# Patient Record
Sex: Female | Born: 1995 | Race: White | Hispanic: No | Marital: Single | Smoking: Former smoker
Health system: Southern US, Community
[De-identification: ages and names within clinical notes are randomized; demographics above are authoritative.]

## PROBLEM LIST (undated history)

## (undated) DIAGNOSIS — F319 Bipolar disorder, unspecified: Secondary | ICD-10-CM

## (undated) DIAGNOSIS — F603 Borderline personality disorder: Secondary | ICD-10-CM

## (undated) DIAGNOSIS — Z789 Other specified health status: Secondary | ICD-10-CM

## (undated) DIAGNOSIS — F419 Anxiety disorder, unspecified: Secondary | ICD-10-CM

## (undated) DIAGNOSIS — J45909 Unspecified asthma, uncomplicated: Secondary | ICD-10-CM

## (undated) HISTORY — PX: NO PAST SURGERIES: SHX2092

---
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
## 2014-01-31 ENCOUNTER — Encounter (HOSPITAL_COMMUNITY): Payer: Self-pay | Admitting: Emergency Medicine

## 2014-01-31 ENCOUNTER — Emergency Department (HOSPITAL_COMMUNITY): Payer: Medicaid Other

## 2014-01-31 ENCOUNTER — Emergency Department (HOSPITAL_COMMUNITY)
Admission: EM | Admit: 2014-01-31 | Discharge: 2014-01-31 | Disposition: A | Discharge status: Home / Self Care | Payer: Medicaid Other | Attending: Emergency Medicine | Admitting: Emergency Medicine

## 2014-01-31 DIAGNOSIS — IMO0002 Reserved for concepts with insufficient information to code with codable children: Secondary | ICD-10-CM | POA: Insufficient documentation

## 2014-01-31 DIAGNOSIS — F17200 Nicotine dependence, unspecified, uncomplicated: Secondary | ICD-10-CM | POA: Insufficient documentation

## 2014-01-31 DIAGNOSIS — Z79899 Other long term (current) drug therapy: Secondary | ICD-10-CM | POA: Diagnosis not present

## 2014-01-31 DIAGNOSIS — J029 Acute pharyngitis, unspecified: Secondary | ICD-10-CM | POA: Diagnosis not present

## 2014-01-31 DIAGNOSIS — R059 Cough, unspecified: Secondary | ICD-10-CM | POA: Insufficient documentation

## 2014-01-31 DIAGNOSIS — IMO0001 Reserved for inherently not codable concepts without codable children: Secondary | ICD-10-CM | POA: Insufficient documentation

## 2014-01-31 DIAGNOSIS — Z8659 Personal history of other mental and behavioral disorders: Secondary | ICD-10-CM | POA: Insufficient documentation

## 2014-01-31 DIAGNOSIS — R0602 Shortness of breath: Secondary | ICD-10-CM | POA: Diagnosis not present

## 2014-01-31 DIAGNOSIS — Z792 Long term (current) use of antibiotics: Secondary | ICD-10-CM | POA: Diagnosis not present

## 2014-01-31 DIAGNOSIS — R05 Cough: Secondary | ICD-10-CM | POA: Diagnosis not present

## 2014-01-31 DIAGNOSIS — J3489 Other specified disorders of nose and nasal sinuses: Secondary | ICD-10-CM | POA: Insufficient documentation

## 2014-01-31 MED ORDER — PREDNISONE 50 MG PO TABS
60.0000 mg | ORAL_TABLET | Freq: Once | ORAL | Status: AC
  Administered 2014-01-31: 60 mg via ORAL
  Filled 2014-01-31 (×2): qty 1

## 2014-01-31 MED ORDER — ALBUTEROL SULFATE (2.5 MG/3ML) 0.083% IN NEBU
2.5000 mg | INHALATION_SOLUTION | Freq: Once | RESPIRATORY_TRACT | Status: AC
  Administered 2014-01-31: 2.5 mg via RESPIRATORY_TRACT
  Filled 2014-01-31: qty 3

## 2014-01-31 MED ORDER — IPRATROPIUM-ALBUTEROL 0.5-2.5 (3) MG/3ML IN SOLN
3.0000 mL | Freq: Once | RESPIRATORY_TRACT | Status: AC
  Administered 2014-01-31: 3 mL via RESPIRATORY_TRACT
  Filled 2014-01-31: qty 3

## 2014-01-31 MED ORDER — AZITHROMYCIN 250 MG PO TABS: 250.0000 mg | ORAL_TABLET | Freq: Every day | ORAL | Status: DC

## 2014-01-31 MED ORDER — BENZONATATE 200 MG PO CAPS: 200.0000 mg | ORAL_CAPSULE | Freq: Three times a day (TID) | ORAL | Status: DC | PRN

## 2014-01-31 MED ORDER — PREDNISONE 20 MG PO TABS: ORAL_TABLET | ORAL | Status: DC

## 2014-01-31 MED ORDER — ALBUTEROL SULFATE HFA 108 (90 BASE) MCG/ACT IN AERS: 1.0000 | INHALATION_SPRAY | Freq: Four times a day (QID) | RESPIRATORY_TRACT | Status: DC | PRN

## 2014-01-31 NOTE — Discharge Instructions (Signed)
Cough, Adult  A cough is a reflex that helps clear your throat and airways. It can help heal the body or may be a reaction to an irritated airway. A cough may only last 2 or 3 weeks (acute) or may last more than 8 weeks (chronic).  CAUSES Acute cough:  Viral or bacterial infections. Chronic cough:  Infections.  Allergies.  Asthma.  Post-nasal drip.  Smoking.  Heartburn or acid reflux.  Some medicines.  Chronic lung problems (COPD).  Cancer. SYMPTOMS   Cough.  Fever.  Chest pain.  Increased breathing rate.  High-pitched whistling sound when breathing (wheezing).  Colored mucus that you cough up (sputum). TREATMENT   A bacterial cough may be treated with antibiotic medicine.  A viral cough must run its course and will not respond to antibiotics.  Your caregiver may recommend other treatments if you have a chronic cough. HOME CARE INSTRUCTIONS   Only take over-the-counter or prescription medicines for pain, discomfort, or fever as directed by your caregiver. Use cough suppressants only as directed by your caregiver.  Use a cold steam vaporizer or humidifier in your bedroom or home to help loosen secretions.  Sleep in a semi-upright position if your cough is worse at night.  Rest as needed.  Stop smoking if you smoke. SEEK IMMEDIATE MEDICAL CARE IF:   You have pus in your sputum.  Your cough starts to worsen.  You cannot control your cough with suppressants and are losing sleep.  You begin coughing up blood.  You have difficulty breathing.  You develop pain which is getting worse or is uncontrolled with medicine.  You have a fever. MAKE SURE YOU:   Understand these instructions.  Will watch your condition.  Will get help right away if you are not doing well or get worse. Document Released: 01/15/2011 Document Revised: 10/11/2011 Document Reviewed: 01/15/2011 Exit[employer] Patient Information 2015 [employer]. This information is not intended  to replace advice given to you by your health care provider. Make sure you discuss any questions you have with your health care provider.  

## 2014-01-31 NOTE — ED Notes (Addendum)
Pt has cough and feels sob since yesterday, Used mother's inhaler with some relief.  Alert, Nasal congestion, sore throat.  Yellow sputum at times

## 2014-02-02 NOTE — ED Provider Notes (Signed)
CSN: [id number]     Arrival date & time 01/31/14  1036 History   First MD Initiated Contact with Patient 01/31/14 1115     Chief Complaint  Patient presents with  . Cough     (Consider location/radiation/quality/duration/timing/severity/associated sxs/prior Treatment) Patient is a 18 y.o. female presenting with cough. The history is provided by the patient.  Cough Cough characteristics:  Productive Sputum characteristics:  Yellow Severity:  Moderate Onset quality:  Gradual Duration:  1 day Timing:  Intermittent Progression:  Worsening Chronicity:  New Smoker: yes   Context: upper respiratory infection   Relieved by:  Nothing Worsened by:  Nothing tried Ineffective treatments:  None tried Associated symptoms: myalgias, rhinorrhea, shortness of breath, sore throat and wheezing   Associated symptoms: no chest pain, no chills, no ear pain, no fever, no headaches, no rash and no sinus congestion   Shortness of breath:    Severity:  Mild   Onset quality:  Gradual   Timing:  Intermittent   Progression:  Unchanged Sore throat:    Severity:  Mild   Onset quality:  Gradual   Timing:  Constant   Progression:  Unchanged Risk factors: no recent infection and no recent travel     Past Medical History  Diagnosis Date  . No pertinent past medical history   . Bipolar 1 disorder   . Anxiety    Past Surgical History  Procedure Laterality Date  . No past surgeries     History reviewed. No pertinent family history. History  Substance Use Topics  . Smoking status: Current Every Day Smoker -- 1.00 packs/day for 1 years    Types: Cigarettes  . Smokeless tobacco: Never Used  . Alcohol Use: No   OB History   Grav Para Term Preterm Abortions TAB SAB Ect Mult Living                 Review of Systems  Constitutional: Negative for fever, chills, activity change and appetite change.  HENT: Positive for congestion, rhinorrhea and sore throat. Negative for ear pain, facial swelling and  trouble swallowing.   Eyes: Negative for visual disturbance.  Respiratory: Positive for cough, shortness of breath and wheezing. Negative for chest tightness and stridor.   Cardiovascular: Negative for chest pain.  Gastrointestinal: Negative for nausea, vomiting and abdominal pain.  Genitourinary: Negative for dysuria and flank pain.  Musculoskeletal: Positive for myalgias. Negative for neck pain and neck stiffness.  Skin: Negative.  Negative for rash.  Neurological: Negative for dizziness, syncope, weakness, numbness and headaches.  Hematological: Negative for adenopathy.  Psychiatric/Behavioral: Negative for confusion.  All other systems reviewed and are negative.     Allergies  Review of patient's allergies indicates no known allergies.  Home Medications   Prior to Admission medications   Medication Sig Start Date End Date Taking? Authorizing Provider  albuterol (PROVENTIL HFA;VENTOLIN HFA) 108 (90 BASE) MCG/ACT inhaler Inhale 1-2 puffs into the lungs every 6 (six) hours as needed for wheezing or shortness of breath. 01/31/14   Van Seymore L. Orpha Dain, PA-C  azithromycin (ZITHROMAX) 250 MG tablet Take 1 tablet (250 mg total) by mouth daily. Take first 2 tablets together, then 1 every day until finished. 01/31/14   Abhiraj Dozal L. Jlynn Ly, PA-C  benzonatate (TESSALON) 200 MG capsule Take 1 capsule (200 mg total) by mouth 3 (three) times daily as needed for cough. 01/31/14   Stephania Macfarlane L. Robert Sunga, PA-C  clotrimazole (LOTRIMIN) 1 % cream Apply to affected area 2 times daily 10/09/12  Mora BellmanHannah S Merrell, PA-C  predniSONE (DELTASONE) 20 MG tablet Take 3 tablets po qd x 2 days, then 2 tablets po qd x 2 days, then 1 tablet po qd x 2 days 01/31/14   Pinkie Manger L. Tashayla Therien, PA-C   BP 122/77  Pulse 96  Temp(Src) 97.7 F (36.5 C) (Oral)  Resp 14  Ht 5\' 8"  (1.727 m)  Wt 158 lb (71.668 kg)  BMI 24.03 kg/m2  SpO2 99%  LMP 01/14/2014 Physical Exam  Nursing note and vitals reviewed. Constitutional: She is oriented to  person, place, and time. She appears well-developed and well-nourished. No distress.  HENT:  Head: Normocephalic and atraumatic.  Right Ear: Tympanic membrane and ear canal normal.  Left Ear: Tympanic membrane and ear canal normal.  Nose: Mucosal edema present.  Mouth/Throat: Uvula is midline and mucous membranes are normal. Posterior oropharyngeal edema and posterior oropharyngeal erythema present. No oropharyngeal exudate or tonsillar abscesses.  Eyes: EOM are normal. Pupils are equal, round, and reactive to light.  Neck: Normal range of motion. Neck supple.  Cardiovascular: Normal rate, regular rhythm, normal heart sounds and intact distal pulses.   No murmur heard. Pulmonary/Chest: Effort normal. She has wheezes. She has no rales. She exhibits no tenderness.  Coarse lung sounds bilaterally with few scattered expiratory wheezes  Abdominal: Soft. She exhibits no distension. There is no tenderness. There is no rebound and no guarding.  Musculoskeletal: Normal range of motion.  Lymphadenopathy:    She has no cervical adenopathy.  Neurological: She is alert and oriented to person, place, and time. Coordination normal.  Skin: Skin is warm and dry.    ED Course  Procedures (including critical care time) Labs Review Labs Reviewed - No data to display  Imaging Review Dg Chest 2 View  01/31/2014   CLINICAL DATA:  Cough  EXAM: CHEST  2 VIEW  COMPARISON:  07/10/2008  FINDINGS: Cardiomediastinal silhouette is stable. No acute infiltrate or pleural effusion. No pulmonary edema. Bony thorax is unremarkable.  IMPRESSION: No active cardiopulmonary disease.   Electronically Signed   By: Natasha MeadLiviu  Pop M.D.   On: 01/31/2014 12:00    EKG Interpretation None      MDM   Final diagnoses:  Cough    Pt is well appearing,  Clinical suspicion for PE is low.  Lungs sounds improved after neb.  Pt is feeling better.  Agrees to treatment plan with prednisone, tessalon, inhaler and zithromax.  Appears stable  for d/c and agrees to return here if needed.      Letcher Schweikert L. Trisha Mangleriplett, PA-C 02/02/14 2253

## 2014-02-04 NOTE — ED Provider Notes (Signed)
Medical screening examination/treatment/procedure(s) were performed by non-physician practitioner and as supervising physician I was immediately available for consultation/collaboration.   EKG Interpretation None        Donivin Wirt W Raena Pau, MD 02/04/14 0936 

## 2014-12-18 ENCOUNTER — Encounter (HOSPITAL_COMMUNITY): Payer: Self-pay | Admitting: Emergency Medicine

## 2014-12-18 ENCOUNTER — Emergency Department (HOSPITAL_COMMUNITY)
Admission: EM | Admit: 2014-12-18 | Discharge: 2014-12-18 | Disposition: A | Discharge status: Home / Self Care | Payer: Medicaid Other | Attending: Emergency Medicine | Admitting: Emergency Medicine

## 2014-12-18 DIAGNOSIS — E860 Dehydration: Secondary | ICD-10-CM | POA: Insufficient documentation

## 2014-12-18 DIAGNOSIS — E876 Hypokalemia: Secondary | ICD-10-CM | POA: Insufficient documentation

## 2014-12-18 DIAGNOSIS — F410 Panic disorder [episodic paroxysmal anxiety] without agoraphobia: Secondary | ICD-10-CM | POA: Insufficient documentation

## 2014-12-18 DIAGNOSIS — Z3202 Encounter for pregnancy test, result negative: Secondary | ICD-10-CM | POA: Insufficient documentation

## 2014-12-18 DIAGNOSIS — Z720 Tobacco use: Secondary | ICD-10-CM | POA: Insufficient documentation

## 2014-12-18 LAB — POC URINE PREG, ED: PREG TEST UR: NEGATIVE

## 2014-12-18 LAB — CBC WITH DIFFERENTIAL/PLATELET
Basophils Absolute: 0 10*3/uL (ref 0.0–0.1)
Basophils Relative: 0 % (ref 0–1)
Eosinophils Absolute: 0.1 10*3/uL (ref 0.0–0.7)
Eosinophils Relative: 2 % (ref 0–5)
HCT: 37.8 % (ref 36.0–46.0)
Hemoglobin: 13.1 g/dL (ref 12.0–15.0)
Lymphocytes Relative: 24 % (ref 12–46)
Lymphs Abs: 1.1 10*3/uL (ref 0.7–4.0)
MCH: 29.1 pg (ref 26.0–34.0)
MCHC: 34.7 g/dL (ref 30.0–36.0)
MCV: 84 fL (ref 78.0–100.0)
Monocytes Absolute: 0.4 10*3/uL (ref 0.1–1.0)
Monocytes Relative: 9 % (ref 3–12)
Neutro Abs: 3.1 10*3/uL (ref 1.7–7.7)
Neutrophils Relative %: 65 % (ref 43–77)
Platelets: 371 10*3/uL (ref 150–400)
RBC: 4.5 MIL/uL (ref 3.87–5.11)
RDW: 13.6 % (ref 11.5–15.5)
WBC: 4.8 10*3/uL (ref 4.0–10.5)

## 2014-12-18 LAB — COMPREHENSIVE METABOLIC PANEL
ALBUMIN: 4.9 g/dL (ref 3.5–5.0)
ALK PHOS: 66 U/L (ref 38–126)
ALT: 22 U/L (ref 14–54)
AST: 24 U/L (ref 15–41)
Anion gap: 16 — ABNORMAL HIGH (ref 5–15)
BILIRUBIN TOTAL: 3.4 mg/dL — AB (ref 0.3–1.2)
BUN: 12 mg/dL (ref 6–20)
CALCIUM: 9.5 mg/dL (ref 8.9–10.3)
CHLORIDE: 101 mmol/L (ref 101–111)
CO2: 17 mmol/L — AB (ref 22–32)
Creatinine, Ser: 0.86 mg/dL (ref 0.44–1.00)
GFR calc Af Amer: >60 mL/min (ref >60)
Glucose, Bld: 91 mg/dL (ref 65–99)
Potassium: 3 mmol/L — ABNORMAL LOW (ref 3.5–5.1)
SODIUM: 134 mmol/L — AB (ref 135–145)
Total Protein: 7.8 g/dL (ref 6.5–8.1)

## 2014-12-18 LAB — RAPID URINE DRUG SCREEN, HOSP PERFORMED
Amphetamines: NOT DETECTED
BARBITURATES: NOT DETECTED
BENZODIAZEPINES: NOT DETECTED
COCAINE: NOT DETECTED
Opiates: NOT DETECTED
TETRAHYDROCANNABINOL: POSITIVE — AB

## 2014-12-18 LAB — CBG MONITORING, ED: Glucose-Capillary: 84 mg/dL (ref 65–99)

## 2014-12-18 LAB — ETHANOL: Alcohol, Ethyl (B): <5 mg/dL (ref <5)

## 2014-12-18 MED ORDER — ONDANSETRON 8 MG PO TBDP
ORAL_TABLET | ORAL | Status: AC
  Filled 2014-12-18: qty 1

## 2014-12-18 MED ORDER — ONDANSETRON 8 MG PO TBDP
8.0000 mg | ORAL_TABLET | Freq: Once | ORAL | Status: AC
  Administered 2014-12-18: 8 mg via ORAL

## 2014-12-18 MED ORDER — LORAZEPAM 1 MG PO TABS
1.0000 mg | ORAL_TABLET | Freq: Once | ORAL | Status: AC
  Administered 2014-12-18: 1 mg via ORAL
  Filled 2014-12-18: qty 1

## 2014-12-18 MED ORDER — POTASSIUM CHLORIDE CRYS ER 20 MEQ PO TBCR
40.0000 meq | EXTENDED_RELEASE_TABLET | Freq: Once | ORAL | Status: AC
  Administered 2014-12-18: 40 meq via ORAL
  Filled 2014-12-18: qty 2

## 2014-12-18 MED ORDER — LORAZEPAM 1 MG PO TABS: 1.0000 mg | ORAL_TABLET | Freq: Three times a day (TID) | ORAL | Status: DC | PRN

## 2014-12-18 NOTE — Discharge Instructions (Signed)
° °  [place] [facility] ° °[facility]  [employer] [facility] °[address]                 [address]         [address]  °[place]                                               [place]                              [place] °Phone:  [phone]                                  Phone:  [phone]                   Phone:  [phone] ° °[facility], [phone] °- [place] [facility] - [facility]- 1-888-581-9988 °      -     [facility] in [place], [address],                                  [phone], Insurance ° °[place] Child Abuse Hotline °(336) 342-1394 or [phone]) 342-3537 (After Hours) ° ° °Behavioral Health Services ° °Substance Abuse Resources: °- [facility]  336-882-2125 °- [facility] 336-784-9470 °- [facility] 336-285-9073 °- [facility] 336-845-3988 °- Residential & Outpatient Substance Abuse Program  800-659-3381 ° °Psychological Services: °- [facility]  832-9600 °- [facility]  [phone] °- [facility], [address], [place], ACCESS LINE: 1-800-853-5163 or 336-641-4981, Http://[email or web address].com/services/adult.htm ° °-  °- [facility]- 342-8273 °- [facility]- 703-3100 °- [facility]- [phone] ° ° ° ° °

## 2014-12-18 NOTE — ED Provider Notes (Signed)
CSN: [id number]     Arrival date & time 12/18/14  0151 History   First MD Initiated Contact with Patient 12/18/14 0214     Chief Complaint  Patient presents with  . Anxiety    Patient is a 19 y.o. female presenting with anxiety. The history is provided by the patient.  Anxiety This is a recurrent problem. The current episode started yesterday. The problem occurs constantly. The problem has been gradually worsening. Associated symptoms include shortness of breath. Nothing aggravates the symptoms. Nothing relieves the symptoms.  pt reports panic attack that started yesterday She reports feeling short of breath and numbness in her extremities No active CP No vomiting/diarrhea No fever She reports stressful situation at home She reports medication non-compliance and she has h/o anxiety in the past  She denies SI/HI  Past Medical History  Diagnosis Date  . No pertinent past medical history   . Bipolar 1 disorder   . Anxiety    Past Surgical History  Procedure Laterality Date  . No past surgeries     No family history on file. History  Substance Use Topics  . Smoking status: Current Every Day Smoker -- 0.50 packs/day for 1 years    Types: Cigarettes  . Smokeless tobacco: Never Used  . Alcohol Use: No   OB History    No data available     Review of Systems  Constitutional: Negative for fever.  Respiratory: Positive for shortness of breath.   Gastrointestinal: Negative for vomiting.  Psychiatric/Behavioral: Negative for suicidal ideas. The patient is nervous/anxious.   All other systems reviewed and are negative.     Allergies  Review of patient's allergies indicates no known allergies.  Home Medications   Prior to Admission medications   Not on File   BP 121/86 mmHg  Pulse 129  Temp(Src) 97.7 F (36.5 C) (Oral)  Resp 28  Ht 5\' 6"  (1.676 m)  Wt 140 lb (63.504 kg)  BMI 22.61 kg/m2  SpO2 100%  LMP 12/14/2014 (Approximate) Physical Exam CONSTITUTIONAL: Well  developed/well nourished, pt is anxious HEAD: Normocephalic/atraumatic EYES: EOMI/PERRL ENMT: Mucous membranes moist NECK: supple no meningeal signs SPINE/BACK:entire spine nontender CV: S1/S2 noted, no murmurs/rubs/gallops noted LUNGS: Lungs are clear to auscultation bilaterally, no apparent distress ABDOMEN: soft, nontender, no rebound or guarding, bowel sounds noted throughout abdomen GU:no cva tenderness NEURO: Pt is awake/alert/appropriate, moves all extremitiesx4.  No facial droop.   EXTREMITIES: pulses normal/equal, full ROM SKIN: warm, color normal PSYCH: anxious  ED Course  Procedures  3:40 AM Pt stable Denies SI Mild dehydration/hypokalemia noted 4:08 AM Pt improved HR is improved She is resting comfortably She is in no distress I feel she is safe for d/c home Advised need for close f/u Short course of ativan ordered BP 106/70 mmHg  Pulse 95  Temp(Src) 97.7 F (36.5 C) (Oral)  Resp 17  Ht 5\' 6"  (1.676 m)  Wt 140 lb (63.504 kg)  BMI 22.61 kg/m2  SpO2 100%  LMP 12/14/2014 (Approximate)  Labs Review Labs Reviewed  COMPREHENSIVE METABOLIC PANEL - Abnormal; Notable for the following:    Sodium 134 (*)    Potassium 3.0 (*)    CO2 17 (*)    Total Bilirubin 3.4 (*)    Anion gap 16 (*)    All other components within normal limits  URINE RAPID DRUG SCREEN (HOSP PERFORMED) - Abnormal; Notable for the following:    Tetrahydrocannabinol POSITIVE (*)    All other components within normal limits  CBC WITH DIFFERENTIAL/PLATELET  ETHANOL  POC URINE PREG, ED  CBG MONITORING, ED     EKG Interpretation   Date/Time:  Wednesday Dec 18 2014 02:20:59 EDT Ventricular Rate:  108 PR Interval:  104 QRS Duration: 90 QT Interval:  360 QTC Calculation: 482 R Axis:   74 Text Interpretation:  Sinus tachycardia RSR' in V1 or V2, probably normal  variant Borderline prolonged QT interval artifact noted No previous ECGs  available Confirmed by Bebe ShaggyWICKLINE  MD, Justene Jensen ([id number])  on 12/18/2014 2:53:20  AM     Medications  potassium chloride SA (K-DUR,KLOR-CON) CR tablet 40 mEq (not administered)  LORazepam (ATIVAN) tablet 1 mg (1 mg Oral Given 12/18/14 0215)  ondansetron (ZOFRAN-ODT) disintegrating tablet 8 mg (8 mg Oral Given 12/18/14 0234)    MDM   Final diagnoses:  Panic attack  Hypokalemia  Dehydration    Nursing notes including past medical history and social history reviewed and considered in documentation Labs/vital reviewed myself and considered during evaluation     Zadie Rhineonald Heydy Montilla, MD 12/18/14 [id number]

## 2014-12-18 NOTE — ED Notes (Signed)
Pt. Reports having "panic attack" after getting in argument with her boyfriend. Pt. Hyperventilating. Pt. C/o tingling to arms and legs. Instructed pt. To slow breathing.

## 2015-08-05 ENCOUNTER — Emergency Department (HOSPITAL_COMMUNITY): Payer: Medicaid Other

## 2015-08-05 ENCOUNTER — Encounter (HOSPITAL_COMMUNITY): Payer: Self-pay | Admitting: Emergency Medicine

## 2015-08-05 ENCOUNTER — Emergency Department (HOSPITAL_COMMUNITY)
Admission: EM | Admit: 2015-08-05 | Discharge: 2015-08-05 | Disposition: A | Discharge status: Home / Self Care | Payer: Medicaid Other | Attending: Emergency Medicine | Admitting: Emergency Medicine

## 2015-08-05 DIAGNOSIS — J4520 Mild intermittent asthma, uncomplicated: Secondary | ICD-10-CM

## 2015-08-05 DIAGNOSIS — F319 Bipolar disorder, unspecified: Secondary | ICD-10-CM | POA: Insufficient documentation

## 2015-08-05 DIAGNOSIS — F17210 Nicotine dependence, cigarettes, uncomplicated: Secondary | ICD-10-CM | POA: Insufficient documentation

## 2015-08-05 DIAGNOSIS — F419 Anxiety disorder, unspecified: Secondary | ICD-10-CM | POA: Insufficient documentation

## 2015-08-05 DIAGNOSIS — J4521 Mild intermittent asthma with (acute) exacerbation: Secondary | ICD-10-CM | POA: Insufficient documentation

## 2015-08-05 HISTORY — DX: Unspecified asthma, uncomplicated: J45.909

## 2015-08-05 MED ORDER — PREDNISONE 50 MG PO TABS
60.0000 mg | ORAL_TABLET | Freq: Once | ORAL | Status: AC
  Administered 2015-08-05: 60 mg via ORAL
  Filled 2015-08-05: qty 1

## 2015-08-05 MED ORDER — ALBUTEROL SULFATE HFA 108 (90 BASE) MCG/ACT IN AERS: 1.0000 | INHALATION_SPRAY | Freq: Four times a day (QID) | RESPIRATORY_TRACT | Status: DC | PRN

## 2015-08-05 MED ORDER — FLUTICASONE PROPIONATE HFA 110 MCG/ACT IN AERO: 2.0000 | INHALATION_SPRAY | Freq: Every day | RESPIRATORY_TRACT | Status: DC

## 2015-08-05 MED ORDER — ALBUTEROL SULFATE (2.5 MG/3ML) 0.083% IN NEBU
2.5000 mg | INHALATION_SOLUTION | Freq: Once | RESPIRATORY_TRACT | Status: AC
  Administered 2015-08-05: 2.5 mg via RESPIRATORY_TRACT
  Filled 2015-08-05: qty 3

## 2015-08-05 MED ORDER — PREDNISONE 20 MG PO TABS: 20.0000 mg | ORAL_TABLET | Freq: Two times a day (BID) | ORAL | Status: DC

## 2015-08-05 NOTE — Discharge Instructions (Signed)

## 2015-08-05 NOTE — ED Provider Notes (Signed)
CSN: [id number]     Arrival date & time 08/05/15  0200 History   First MD Initiated Contact with Patient 08/05/15 0247     Chief Complaint  Patient presents with  . Asthma      HPI  Patient reinjured evaluation of cough and wheezing. Has a inhaler at home as her mother's that she uses fairly often. She states she has never been formally diagnosed with asthma. Does smoke. No URI symptoms now states she had a cough and runny nose a few days ago. No fever. No chest pain. No other symptoms.  Past Medical History  Diagnosis Date  . No pertinent past medical history   . Bipolar 1 disorder (HCC)   . Anxiety   . Asthma    Past Surgical History  Procedure Laterality Date  . No past surgeries     History reviewed. No pertinent family history. Social History  Substance Use Topics  . Smoking status: Current Every Day Smoker -- 0.50 packs/day for 1 years    Types: Cigarettes  . Smokeless tobacco: Never Used  . Alcohol Use: No   OB History    No data available     Review of Systems  Constitutional: Negative for fever, chills, diaphoresis, appetite change and fatigue.  HENT: Negative for mouth sores, sore throat and trouble swallowing.   Eyes: Negative for visual disturbance.  Respiratory: Positive for cough, shortness of breath and wheezing. Negative for chest tightness.   Cardiovascular: Negative for chest pain.  Gastrointestinal: Negative for nausea, vomiting, abdominal pain, diarrhea and abdominal distention.  Endocrine: Negative for polydipsia, polyphagia and polyuria.  Genitourinary: Negative for dysuria, frequency and hematuria.  Musculoskeletal: Negative for gait problem.  Skin: Negative for color change, pallor and rash.  Neurological: Negative for dizziness, syncope, light-headedness and headaches.  Hematological: Does not bruise/bleed easily.  Psychiatric/Behavioral: Negative for behavioral problems and confusion.      Allergies  Review of patient's allergies  indicates no known allergies.  Home Medications   Prior to Admission medications   Medication Sig Start Date End Date Taking? Authorizing Provider  albuterol (PROVENTIL HFA;VENTOLIN HFA) 108 (90 Base) MCG/ACT inhaler Inhale 1-2 puffs into the lungs every 6 (six) hours as needed for wheezing. 08/05/15   Rolland PorterMark Lanna Labella, MD  fluticasone (FLOVENT HFA) 110 MCG/ACT inhaler Inhale 2 puffs into the lungs daily. 08/05/15   Rolland PorterMark Antario Yasuda, MD  LORazepam (ATIVAN) 1 MG tablet Take 1 tablet (1 mg total) by mouth every 8 (eight) hours as needed for anxiety. 12/18/14   Zadie Rhineonald Wickline, MD  predniSONE (DELTASONE) 20 MG tablet Take 1 tablet (20 mg total) by mouth 2 (two) times daily with a meal. 08/05/15   Rolland PorterMark Ersel Wadleigh, MD   BP 127/76 mmHg  Pulse 106  Temp(Src) 97.9 F (36.6 C) (Oral)  Resp 22  Ht 5\' 6"  (1.676 m)  Wt 128 lb (58.06 kg)  BMI 20.67 kg/m2  SpO2 95%  LMP 06/29/2015 Physical Exam  Constitutional: She is oriented to person, place, and time. She appears well-developed and well-nourished. No distress.  HENT:  Head: Normocephalic.  Eyes: Conjunctivae are normal. Pupils are equal, round, and reactive to light. No scleral icterus.  Neck: Normal range of motion. Neck supple. No thyromegaly present.  Cardiovascular: Normal rate and regular rhythm.  Exam reveals no gallop and no friction rub.   No murmur heard. Pulmonary/Chest: Effort normal. No respiratory distress. She has wheezes. She has no rales.  Abdominal: Soft. Bowel sounds are normal. She exhibits no distension.  There is no tenderness. There is no rebound.  Musculoskeletal: Normal range of motion.  Neurological: She is alert and oriented to person, place, and time.  Skin: Skin is warm and dry. No rash noted.  Psychiatric: She has a normal mood and affect. Her behavior is normal.    ED Course  Procedures (including critical care time) Labs Review Labs Reviewed - No data to display  Imaging Review Dg Chest 2 View  08/05/2015  CLINICAL DATA:  Acute  onset of wheezing, congestion and productive cough. Initial encounter. EXAM: CHEST  2 VIEW COMPARISON:  Chest radiograph performed 01/31/2014 FINDINGS: The lungs are well-aerated and clear. There is no evidence of focal opacification, pleural effusion or pneumothorax. The heart is normal in size; the mediastinal contour is within normal limits. No acute osseous abnormalities are seen. IMPRESSION: No acute cardiopulmonary process seen. Electronically Signed   By: Roanna Raider M.D.   On: 08/05/2015 02:53   I have personally reviewed and evaluated these images and lab results as part of my medical decision-making.   EKG Interpretation None      MDM   Final diagnoses:  Asthma, mild intermittent, uncomplicated    Patient given single nebulized albuterol. Symptoms improved. Clear lungs well oxygenated. Appropriate for discharge home. Plan prednisone 5 days, albuterol MDI, Flovent MDI daily    Rolland Porter, MD 08/05/15 [phone]

## 2015-08-05 NOTE — ED Notes (Signed)
Patient complaining of cough and wheezing since yesterday. States she is using inhaler with no relief.

## 2015-09-01 ENCOUNTER — Emergency Department (HOSPITAL_COMMUNITY)
Admission: EM | Admit: 2015-09-01 | Discharge: 2015-09-01 | Disposition: A | Discharge status: Home / Self Care | Payer: Medicaid Other | Attending: Emergency Medicine | Admitting: Emergency Medicine

## 2015-09-01 ENCOUNTER — Encounter (HOSPITAL_COMMUNITY): Payer: Self-pay | Admitting: *Deleted

## 2015-09-01 DIAGNOSIS — Z7951 Long term (current) use of inhaled steroids: Secondary | ICD-10-CM | POA: Insufficient documentation

## 2015-09-01 DIAGNOSIS — F17210 Nicotine dependence, cigarettes, uncomplicated: Secondary | ICD-10-CM | POA: Insufficient documentation

## 2015-09-01 DIAGNOSIS — Z7952 Long term (current) use of systemic steroids: Secondary | ICD-10-CM | POA: Insufficient documentation

## 2015-09-01 DIAGNOSIS — J45901 Unspecified asthma with (acute) exacerbation: Secondary | ICD-10-CM | POA: Insufficient documentation

## 2015-09-01 DIAGNOSIS — F419 Anxiety disorder, unspecified: Secondary | ICD-10-CM | POA: Insufficient documentation

## 2015-09-01 DIAGNOSIS — Z79899 Other long term (current) drug therapy: Secondary | ICD-10-CM | POA: Insufficient documentation

## 2015-09-01 MED ORDER — ALBUTEROL SULFATE HFA 108 (90 BASE) MCG/ACT IN AERS
2.0000 | INHALATION_SPRAY | RESPIRATORY_TRACT | Status: DC | PRN
  Administered 2015-09-01: 2 via RESPIRATORY_TRACT
  Filled 2015-09-01: qty 6.7

## 2015-09-01 MED ORDER — ALBUTEROL SULFATE (2.5 MG/3ML) 0.083% IN NEBU
INHALATION_SOLUTION | RESPIRATORY_TRACT | Status: AC
  Administered 2015-09-01: 5 mg
  Filled 2015-09-01: qty 6

## 2015-09-01 MED ORDER — ALBUTEROL SULFATE HFA 108 (90 BASE) MCG/ACT IN AERS: 2.0000 | INHALATION_SPRAY | RESPIRATORY_TRACT | Status: DC | PRN

## 2015-09-01 MED ORDER — IPRATROPIUM BROMIDE 0.02 % IN SOLN
RESPIRATORY_TRACT | Status: AC
  Administered 2015-09-01: 0.5 mg
  Filled 2015-09-01: qty 2.5

## 2015-09-01 NOTE — Discharge Instructions (Signed)
Use the inhaler as needed for wheezing. If you are having frequent episodes that you describe you need to get a primary care doctor and they may put you on a steroid inhaler in addition to your albuterol.    Asthma Attack Prevention While you may not be able to control the fact that you have asthma, you can take actions to prevent asthma attacks. The best way to prevent asthma attacks is to maintain good control of your asthma. You can achieve this by:  Taking your medicines as directed.  Avoiding things that can irritate your airways or make your asthma symptoms worse (asthma triggers).  Keeping track of how well your asthma is controlled and of any changes in your symptoms.  Responding quickly to worsening asthma symptoms (asthma attack).  Seeking emergency care when it is needed. WHAT ARE SOME WAYS TO PREVENT AN ASTHMA ATTACK? Have a Plan Work with your health care provider to create a written plan for managing and treating your asthma attacks (asthma action plan). This plan includes:  A list of your asthma triggers and how you can avoid them.  Information on when medicines should be taken and when their dosages should be changed.  The use of a device that measures how well your lungs are working (peak flow meter). Monitor Your Asthma Use your peak flow meter and record your results in a journal every day. A drop in your peak flow numbers on one or more days may indicate the start of an asthma attack. This can happen even before you start to feel symptoms. You can prevent an asthma attack from getting worse by following the steps in your asthma action plan. Avoid Asthma Triggers Work with your asthma health care provider to find out what your asthma triggers are. This can be done by:  Allergy testing.  Keeping a journal that notes when asthma attacks occur and the factors that may have contributed to them.  Determining if there are other medical conditions that are making your  asthma worse. Once you have determined your asthma triggers, take steps to avoid them. This may include avoiding excessive or prolonged exposure to:  Dust. Have someone dust and vacuum your home for you once or twice a week. Using a high-efficiency particulate arrestance (HEPA) vacuum is best.  Smoke. This includes campfire smoke, forest fire smoke, and secondhand smoke from tobacco products.  Pet dander. Avoid contact with animals that you know you are allergic to.  Allergens from trees, grasses or pollens. Avoid spending a lot of time outdoors when pollen counts are high, and on very windy days.  Very cold, dry, or humid air.  Mold.  Foods that contain high amounts of sulfites.  Strong odors.  Outdoor air pollutants, such as Museum/gallery exhibitions officer.  Indoor air pollutants, such as aerosol sprays and fumes from household cleaners.  Household pests, including dust mites and cockroaches, and pest droppings.  Certain medicines, including NSAIDs. Always talk to your health care provider before stopping or starting any new medicines. Medicines Take over-the-counter and prescription medicines only as told by your health care provider. Many asthma attacks can be prevented by carefully following your medicine schedule. Taking your medicines correctly is especially important when you cannot avoid certain asthma triggers. Act Quickly If an asthma attack does happen, acting quickly can decrease how severe it is and how long it lasts. Take these steps:   Pay attention to your symptoms. If you are coughing, wheezing, or having difficulty breathing, do not  wait to see if your symptoms go away on their own. Follow your asthma action plan.  If you have followed your asthma action plan and your symptoms are not improving, call your health care provider or seek immediate medical care at the nearest hospital. It is important to note how often you need to use your fast-acting rescue inhaler. If you are using  your rescue inhaler more often, it may mean that your asthma is not under control. Adjusting your asthma treatment plan may help you to prevent future asthma attacks and help you to gain better control of your condition. HOW CAN I PREVENT AN ASTHMA ATTACK WHEN I EXERCISE? Follow advice from your health care provider about whether you should use your fast-acting inhaler before exercising. Many people with asthma experience exercise-induced bronchoconstriction (EIB). This condition often worsens during vigorous exercise in cold, humid, or dry environments. Usually, people with EIB can stay very active by pre-treating with a fast-acting inhaler before exercising.   This information is not intended to replace advice given to you by your health care provider. Make sure you discuss any questions you have with your health care provider.   Document Released: 07/07/2009 Document Revised: 04/09/2015 Document Reviewed: 12/19/2014 Elsevier Interactive Patient Education [employer]

## 2015-09-01 NOTE — ED Provider Notes (Signed)
CSN: [id number]     Arrival date & time 09/01/15  9147 History   First MD Initiated Contact with Patient 09/01/15 0545     Chief Complaint  Patient presents with  . Shortness of Breath     (Consider location/radiation/quality/duration/timing/severity/associated sxs/prior Treatment) HPI  Patient has a history of reactive airway disease. She states she was awakened from sleep about 2 hours ago with wheezing and feeling short of breath. She has mild cough with white mucus at times, she denies sore throat, she does describe some mild clear rhinorrhea, she denies chest pain. She states her chest felt tight earlier however she is already been started on a nebulizer and she states her chest is less tight and is gone now. She states she comes to the ED about every 2 weeks due to asthma flares and she has asthma flares at home about every 2 days. She states she left her inhaler at her mother's house.  PCP Dr Nelva Bush  Past Medical History  Diagnosis Date  . No pertinent past medical history   . Bipolar 1 disorder (HCC)   . Anxiety   . Asthma    Past Surgical History  Procedure Laterality Date  . No past surgeries     History reviewed. No pertinent family history. Social History  Substance Use Topics  . Smoking status: Current Every Day Smoker -- 0.50 packs/day for 1 years    Types: Cigarettes  . Smokeless tobacco: Never Used  . Alcohol Use: No  unemployed  OB History    No data available     Review of Systems  All other systems reviewed and are negative.     Allergies  Review of patient's allergies indicates no known allergies.  Home Medications   Prior to Admission medications   Medication Sig Start Date End Date Taking? Authorizing Provider  albuterol (PROVENTIL HFA;VENTOLIN HFA) 108 (90 Base) MCG/ACT inhaler Inhale 2 puffs into the lungs every 4 (four) hours as needed. 09/01/15   Devoria Albe, MD  fluticasone (FLOVENT HFA) 110 MCG/ACT inhaler Inhale 2 puffs into the lungs  daily. 08/05/15   Rolland Porter, MD  LORazepam (ATIVAN) 1 MG tablet Take 1 tablet (1 mg total) by mouth every 8 (eight) hours as needed for anxiety. 12/18/14   Zadie Rhine, MD  predniSONE (DELTASONE) 20 MG tablet Take 1 tablet (20 mg total) by mouth 2 (two) times daily with a meal. 08/05/15   Rolland Porter, MD   BP 119/76 mmHg  Pulse 101  Temp(Src) 98.2 F (36.8 C) (Oral)  Resp 34  Ht  (1.676 m)  Wt 130 lb (58.968 kg)  BMI 20.99 kg/m2  SpO2 96%  LMP 06/28/2015  Vital signs normal except for tachycardia and tachypnea  Physical Exam  Constitutional: She is oriented to person, place, and time. She appears well-developed and well-nourished.  Non-toxic appearance. She does not appear ill. No distress.  HENT:  Head: Normocephalic and atraumatic.  Right Ear: External ear normal.  Left Ear: External ear normal.  Nose: Nose normal. No mucosal edema or rhinorrhea.  Mouth/Throat: Oropharynx is clear and moist and mucous membranes are normal. No dental abscesses or uvula swelling.  Eyes: Conjunctivae and EOM are normal. Pupils are equal, round, and reactive to light.  Neck: Normal range of motion and full passive range of motion without pain. Neck supple.  Cardiovascular: Normal rate, regular rhythm and normal heart sounds.  Exam reveals no gallop and no friction rub.   No murmur heard. Pulmonary/Chest:  Effort normal. Tachypnea noted. No respiratory distress. She has decreased breath sounds. She has no rales. She exhibits no tenderness and no crepitus.  Patient examined at the beginning of her nebulizer, she is noted to have rare rhonchi.  Abdominal: Soft. Normal appearance and bowel sounds are normal. She exhibits no distension. There is no tenderness. There is no rebound and no guarding.  Musculoskeletal: Normal range of motion. She exhibits no edema or tenderness.  Moves all extremities well.   Neurological: She is alert and oriented to person, place, and time. She has normal strength. No  cranial nerve deficit.  Skin: Skin is warm, dry and intact. No rash noted. No erythema. No pallor.  Psychiatric: She has a normal mood and affect. Her speech is normal and behavior is normal. Her mood appears not anxious.  Nursing note and vitals reviewed.   ED Course  Procedures (including critical care time)  Medications  albuterol (PROVENTIL HFA;VENTOLIN HFA) 108 (90 Base) MCG/ACT inhaler 2 puff (not administered)  ipratropium (ATROVENT) 0.02 % nebulizer solution (0.5 mg  Given 09/01/15 0543)  albuterol (PROVENTIL) (2.5 MG/3ML) 0.083% nebulizer solution (5 mg  Given 09/01/15 0543)    Patient was getting her first nebulizer during my initial exam.  Recheck at 6:30 AM patient states she's feeling much better. On exam she has much improved air movement without wheezing or rhonchi. This point she was felt to be ready for discharge.  Patient has only had 2 ED visits in this ED in the past 6 months. I asked her where she normally goes and she states she also goes to [facility] when I look at the number of x-rays that she is had ordered she has only had one in the past year. I am not sure for history of going to the [facility]SS frequent as she relates. Patient was referred to [facility] and she was given a inhaler to use from the ED and also a prescription. She was not started on steroids, she easily improved with one treatment and if she had had her inhaler she probably would not of had to come to the ED.    MDM   Final diagnoses:  Asthma exacerbation   New Prescriptions   ALBUTEROL (PROVENTIL HFA;VENTOLIN HFA) 108 (90 BASE) MCG/ACT INHALER    Inhale 2 puffs into the lungs every 4 (four) hours as needed.    Plan discharge  Devoria Albe, MD, Concha Pyo, MD 09/01/15 [phone]

## 2015-09-01 NOTE — ED Notes (Addendum)
Pt c/o waking up sob and having chest tightness. Pt denies cough or cold symptoms. Pt has asthma and her inhaler is at her moms. Pt

## 2015-09-23 ENCOUNTER — Emergency Department (HOSPITAL_COMMUNITY)
Admission: EM | Admit: 2015-09-23 | Discharge: 2015-09-23 | Disposition: A | Discharge status: Home / Self Care | Payer: No Typology Code available for payment source | Attending: Emergency Medicine | Admitting: Emergency Medicine

## 2015-09-23 ENCOUNTER — Encounter (HOSPITAL_COMMUNITY): Payer: Self-pay | Admitting: *Deleted

## 2015-09-23 DIAGNOSIS — K0889 Other specified disorders of teeth and supporting structures: Secondary | ICD-10-CM | POA: Insufficient documentation

## 2015-09-23 DIAGNOSIS — J45909 Unspecified asthma, uncomplicated: Secondary | ICD-10-CM | POA: Insufficient documentation

## 2015-09-23 NOTE — ED Notes (Signed)
Pt states dental pain for the past month. Has not seen a dentist, waiting on free clinic.

## 2016-03-03 ENCOUNTER — Emergency Department (HOSPITAL_COMMUNITY)
Admission: EM | Admit: 2016-03-03 | Discharge: 2016-03-03 | Disposition: A | Discharge status: Home / Self Care | Payer: Medicaid Other | Attending: Emergency Medicine | Admitting: Emergency Medicine

## 2016-03-03 ENCOUNTER — Encounter (HOSPITAL_COMMUNITY): Payer: Self-pay | Admitting: Emergency Medicine

## 2016-03-03 DIAGNOSIS — K0889 Other specified disorders of teeth and supporting structures: Secondary | ICD-10-CM | POA: Diagnosis not present

## 2016-03-03 DIAGNOSIS — Z791 Long term (current) use of non-steroidal anti-inflammatories (NSAID): Secondary | ICD-10-CM | POA: Diagnosis not present

## 2016-03-03 DIAGNOSIS — Z79899 Other long term (current) drug therapy: Secondary | ICD-10-CM | POA: Insufficient documentation

## 2016-03-03 DIAGNOSIS — Z87891 Personal history of nicotine dependence: Secondary | ICD-10-CM | POA: Diagnosis not present

## 2016-03-03 MED ORDER — PENICILLIN V POTASSIUM 250 MG PO TABS
500.0000 mg | ORAL_TABLET | Freq: Once | ORAL | Status: AC
  Administered 2016-03-03: 500 mg via ORAL
  Filled 2016-03-03: qty 2

## 2016-03-03 MED ORDER — ACETAMINOPHEN 325 MG PO TABS
650.0000 mg | ORAL_TABLET | Freq: Once | ORAL | Status: AC
  Administered 2016-03-03: 650 mg via ORAL
  Filled 2016-03-03: qty 2

## 2016-03-03 MED ORDER — PENICILLIN V POTASSIUM 500 MG PO TABS: 500.0000 mg | ORAL_TABLET | Freq: Four times a day (QID) | ORAL | 0 refills | Status: AC

## 2016-03-03 NOTE — Discharge Instructions (Signed)
Take the penicillin as prescribed and be sure to complete the entire course. Discontinue this medication if you experience rash and return immediately to the emergency department. Follow-up with a dentist within 2 days to have your tooth reevaluated. Follow-up with your primary care provider or OB/GYN within 2 days.  Return to the emergency department if you experience worsening pain, swelling of your throat or under your tongue, difficulty swallowing, difficulty opening your mouth fevers, chills, or any other concerning symptoms.

## 2016-03-03 NOTE — ED Provider Notes (Signed)
[facility] Provider Note   CSN: [id number] Arrival date & time: 03/03/16  2102  First Provider Contact:  None       History   Chief Complaint Chief Complaint  Patient presents with  . Dental Pain    HPI Jacqueline Velasquez is a 20 y.o. female.  HPI   Patient is a 20 year old pregnant female who presents the ED with worsening right lower dental/jaw pain since yesterday. Pain was intermittent until roughly 4 hours ago the pain became constant, sharp/stabbing, worse with touch, 10/10, she is tried 1600 mg of Tylenol today without relief. She's never had this type of pain before. Patient states she broke this tooth roughly 1 year ago. She denies fever, chills, nausea, vomiting, abdominal pain, difficulty swelling, swelling of the throat, pain under her tongue.  Past Medical History:  Diagnosis Date  . Anxiety   . Asthma   . Bipolar 1 disorder (HCC)   . No pertinent past medical history     Patient Active Problem List   Diagnosis Date Noted  . Mood disorder (HCC) 01/24/2012  . Borderline personality disorder 01/24/2012    Past Surgical History:  Procedure Laterality Date  . NO PAST SURGERIES      OB History    Gravida Para Term Preterm AB Living   1             SAB TAB Ectopic Multiple Live Births                   Home Medications    Prior to Admission medications   Medication Sig Start Date End Date Taking? Authorizing Provider  acetaminophen (PAIN & FEVER CHILDRENS) 80 MG chewable tablet Chew 80-160 mg by mouth once as needed for mild pain.   Yes Historical Provider, MD  ibuprofen (ADVIL,MOTRIN) 200 MG tablet Take 200-400 mg by mouth every 6 (six) hours as needed for mild pain or moderate pain.   Yes Historical Provider, MD  penicillin v potassium (VEETID) 500 MG tablet Take 1 tablet (500 mg total) by mouth 4 (four) times daily. For 7 days 03/03/16 03/10/16  Jerre Simon, PA    Family History No family history on file.  Social History Social History    Substance Use Topics  . Smoking status: Former Smoker    Packs/day: 0.50    Years: 1.00    Types: Cigarettes  . Smokeless tobacco: Never Used  . Alcohol use No     Allergies   Review of patient's allergies indicates no known allergies.   Review of Systems Review of Systems  Constitutional: Negative for chills and fever.  HENT: Positive for dental problem. Negative for facial swelling, trouble swallowing and voice change.   Gastrointestinal: Negative for nausea and vomiting.     Physical Exam Updated Vital Signs BP 94/79 (BP Location: Left Arm)   Pulse 92   Temp 98 F (36.7 C) (Oral)   Resp 20   Ht 5' 5.5" (1.664 m)   Wt 64.2 kg   LMP 11/17/2015 (Approximate)   SpO2 100%   BMI 23.21 kg/m   Physical Exam  Constitutional: She appears well-developed and well-nourished. No distress.  HENT:  Head: Normocephalic and atraumatic.  Mouth/Throat: Oropharynx is clear and moist and mucous membranes are normal. No trismus in the jaw. Abnormal dentition. Dental caries present. No dental abscesses or uvula swelling.    Broken tooth noted of the right lower jaw, surrounding erythema of the gingiva, mild edema noted, no  fluctuance or drainable abscess, no trismus, tooth is TTP  Eyes: Conjunctivae are normal.  Pulmonary/Chest: Effort normal. No respiratory distress.  Musculoskeletal: Normal range of motion.  Neurological: She is alert. Coordination normal.  Skin: Skin is warm and dry. She is not diaphoretic.  Psychiatric: She has a normal mood and affect. Her behavior is normal.  Nursing note and vitals reviewed.    ED Treatments / Results  Labs (all labs ordered are listed, but only abnormal results are displayed) Labs Reviewed - No data to display  EKG  EKG Interpretation None       Radiology No results found.  Procedures Procedures (including critical care time)  Medications Ordered in ED Medications  penicillin v potassium (VEETID) tablet 500 mg (not  administered)  acetaminophen (TYLENOL) tablet 650 mg (not administered)     Initial Impression / Assessment and Plan / ED Course  I have reviewed the triage vital signs and the nursing notes.  Pertinent labs & imaging results that were available during my care of the patient were reviewed by me and considered in my medical decision making (see chart for details).  Clinical Course    Patient with dentalgia.  No abscess requiring immediate incision and drainage.  Exam not concerning for Ludwig's angina or pharyngeal abscess.  Will treat with penicillin and tylenol. Pt instructed to follow-up with dentist and PCP or OB/GYN in 2 days. Discussed return precautions. Pt safe for discharge And expressed understanding to the discharge instructions.  Final Clinical Impressions(s) / ED Diagnoses   Final diagnoses:  Pain, dental    New Prescriptions New Prescriptions   PENICILLIN V POTASSIUM (VEETID) 500 MG TABLET    Take 1 tablet (500 mg total) by mouth 4 (four) times daily. For 7 days     Jerre Simon, [place] 03/04/16 7902    Bethann Berkshire, MD 03/04/16 [phone]

## 2016-03-03 NOTE — ED Triage Notes (Signed)
Pt c/o rt sided jaw pain and ear pain that started today.

## 2016-03-08 IMAGING — CR DG CHEST 2V
2 series · 2 of 2 positions shown · non-contrast
Comparison: 07/10/2008

CLINICAL DATA: Cough

EXAM:
CHEST  2 VIEW

[view not recorded (1 of 2)]
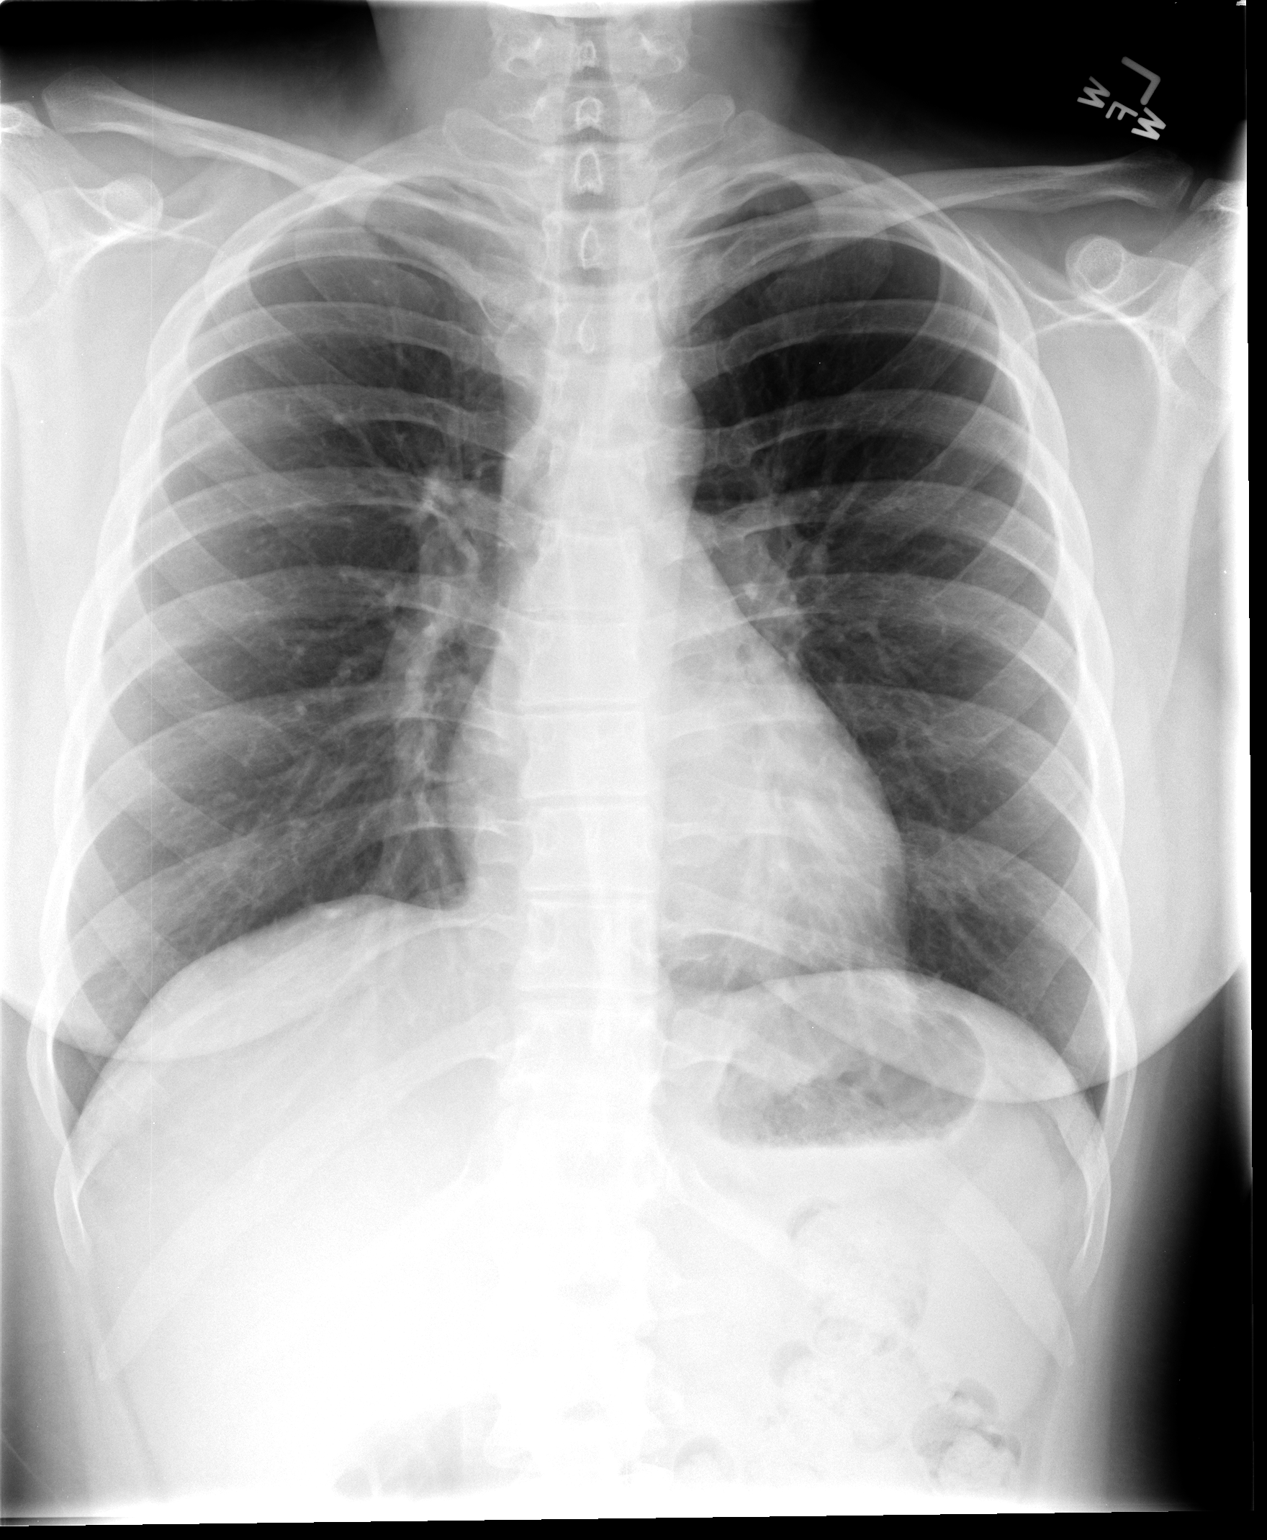

[view not recorded (2 of 2)]
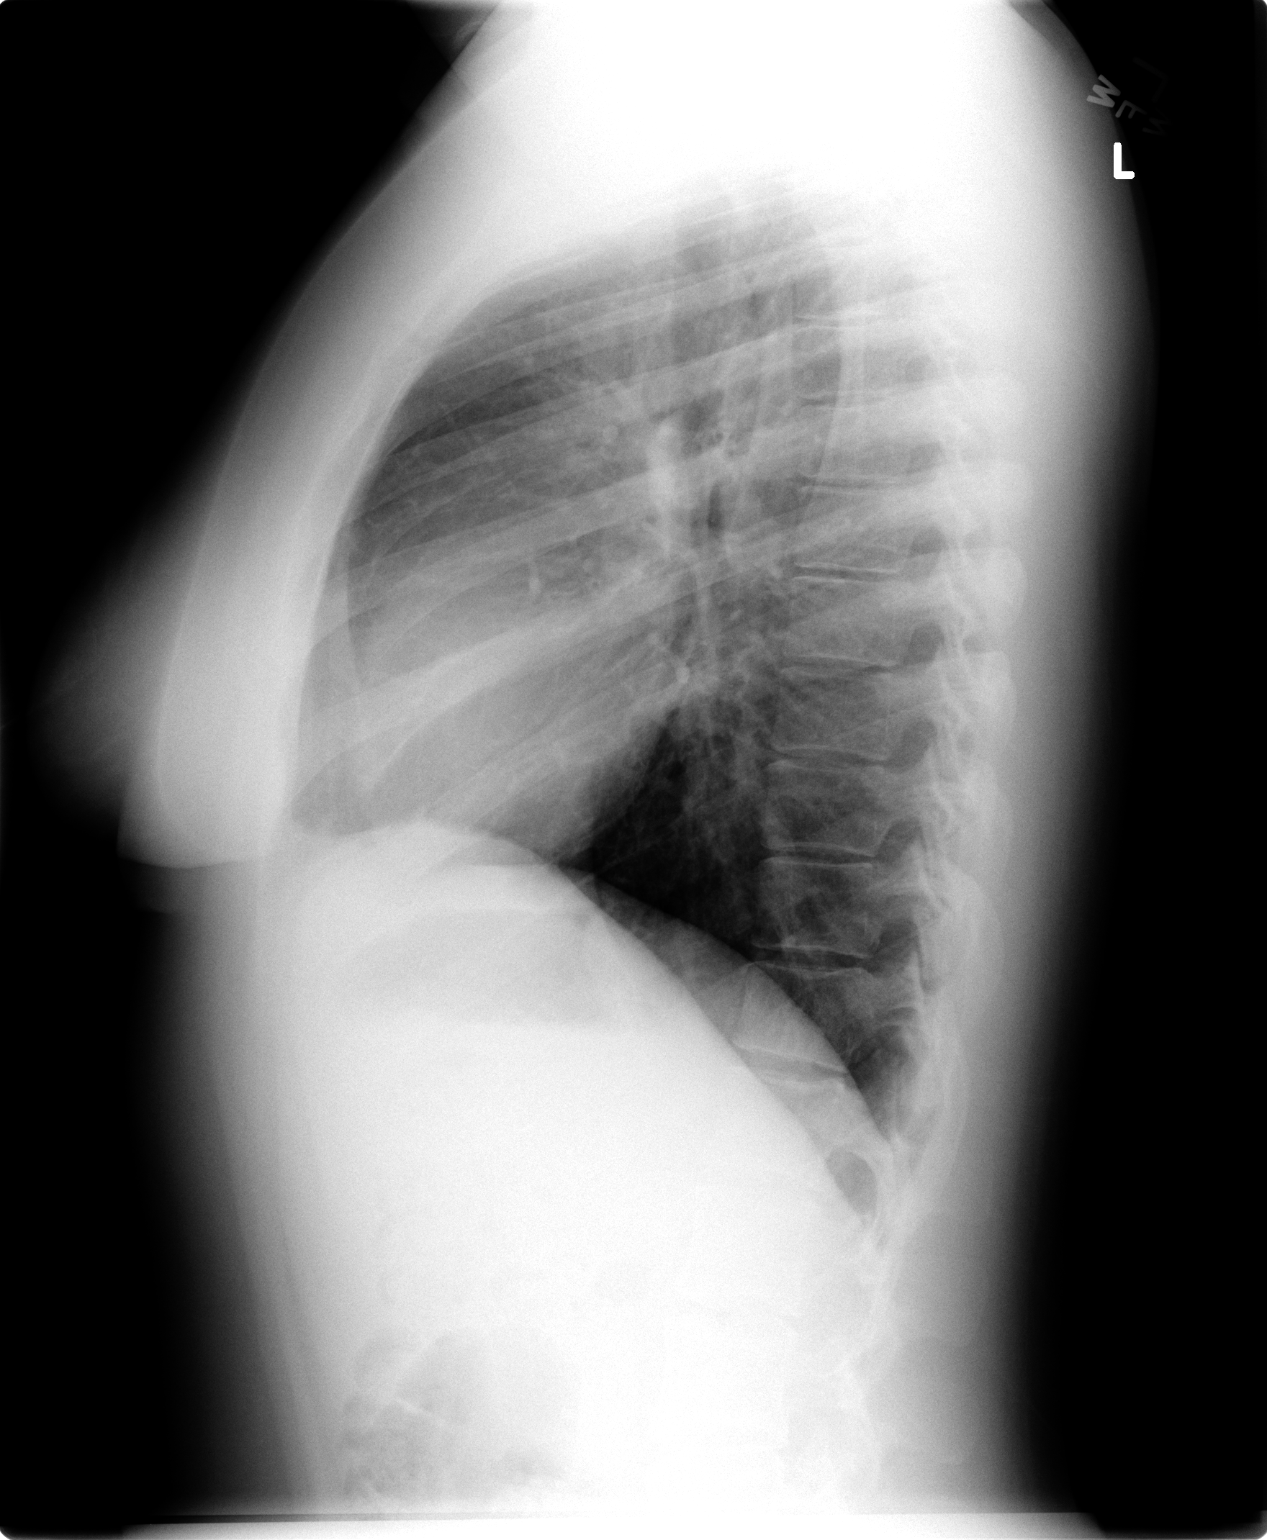

[2 of 2 positions shown; findings below may reference images not displayed]

FINDINGS: Cardiomediastinal silhouette is stable. No acute infiltrate or
pleural effusion. No pulmonary edema. Bony thorax is unremarkable.
IMPRESSION: No active cardiopulmonary disease.

## 2016-03-24 ENCOUNTER — Encounter (HOSPITAL_COMMUNITY): Payer: Self-pay | Admitting: Emergency Medicine

## 2016-03-24 ENCOUNTER — Emergency Department (HOSPITAL_COMMUNITY)
Admission: EM | Admit: 2016-03-24 | Discharge: 2016-03-24 | Disposition: A | Discharge status: Home / Self Care | Payer: Medicaid Other | Attending: Emergency Medicine | Admitting: Emergency Medicine

## 2016-03-24 DIAGNOSIS — J45901 Unspecified asthma with (acute) exacerbation: Secondary | ICD-10-CM | POA: Diagnosis not present

## 2016-03-24 DIAGNOSIS — Z3A18 18 weeks gestation of pregnancy: Secondary | ICD-10-CM | POA: Insufficient documentation

## 2016-03-24 DIAGNOSIS — Z87891 Personal history of nicotine dependence: Secondary | ICD-10-CM | POA: Diagnosis not present

## 2016-03-24 DIAGNOSIS — O99512 Diseases of the respiratory system complicating pregnancy, second trimester: Secondary | ICD-10-CM | POA: Insufficient documentation

## 2016-03-24 DIAGNOSIS — R0602 Shortness of breath: Secondary | ICD-10-CM | POA: Diagnosis present

## 2016-03-24 MED ORDER — IPRATROPIUM-ALBUTEROL 0.5-2.5 (3) MG/3ML IN SOLN
3.0000 mL | Freq: Once | RESPIRATORY_TRACT | Status: AC
  Administered 2016-03-24: 3 mL via RESPIRATORY_TRACT
  Filled 2016-03-24: qty 3

## 2016-03-24 MED ORDER — ALBUTEROL SULFATE HFA 108 (90 BASE) MCG/ACT IN AERS: 1.0000 | INHALATION_SPRAY | Freq: Four times a day (QID) | RESPIRATORY_TRACT | 0 refills | Status: DC | PRN

## 2016-03-24 NOTE — Discharge Instructions (Signed)
You were seen in the ED for an asthma exacerbation.  You were given a breathing treatment and your lungs greatly improved.  Please pick up your inhaler ASAP and keep it with you.  If you have shortness of breath or wheezing, take 1-2 puffs every 6 hours.  If you have worsening shortness of breath which is not relieved by your inhaler, please come back to the ED.

## 2016-03-24 NOTE — ED Provider Notes (Signed)
[facility] Provider Note   CSN: [id number] Arrival date & time: 03/24/16  0805     History   Chief Complaint Chief Complaint  Patient presents with  . Shortness of Breath    HPI Jacqueline Velasquez is a 20 y.o. female  G1P0 (18 weeks) with history of asthma and anxiety who presents with 1 day of dyspnea.  Since yesterday, she has noticed chest tightness, shortness of breath, wheezing, and cough productive of scant mucous.  No fever, chest pain or palpitations. Does not have her own inhaler, but used family member's albuterol inhaler twice (last 0300 today) with moderate relief for 1-2 hours.  Never admitted for asthma exacerbation, but has presented to ED previously.  On last ED visit here for asthma in 1/30 was discharged home with Rx for albuterol inhaler after good improvement with 1x nebulizer treatment.  Sick contact with friend with URI symptoms.  Up to date with routine prenatal care.  Normal risk pregnancy.  Boyfriend/father present for interview.  No abdominal pain/cramping or vaginal bleeding.  No perceptible fetal movement yet.  HPI  Past Medical History:  Diagnosis Date  . Anxiety   . Asthma   . Bipolar 1 disorder (HCC)   . No pertinent past medical history     Patient Active Problem List   Diagnosis Date Noted  . Mood disorder (HCC) 01/24/2012  . Borderline personality disorder 01/24/2012    Past Surgical History:  Procedure Laterality Date  . NO PAST SURGERIES      OB History    Gravida Para Term Preterm AB Living   1             SAB TAB Ectopic Multiple Live Births                   Home Medications    Prior to Admission medications   Medication Sig Start Date End Date Taking? Authorizing Provider  acetaminophen (PAIN & FEVER CHILDRENS) 80 MG chewable tablet Chew 80-160 mg by mouth once as needed for mild pain.    Historical Provider, MD  ibuprofen (ADVIL,MOTRIN) 200 MG tablet Take 200-400 mg by mouth every 6 (six) hours as needed for  mild pain or moderate pain.    Historical Provider, MD    Family History Family History  Problem Relation Age of Onset  . Asthma Mother     Social History Social History  Substance Use Topics  . Smoking status: Former Smoker    Packs/day: 0.50    Years: 1.00    Types: Cigarettes  . Smokeless tobacco: Never Used  . Alcohol use No     Allergies   Review of patient's allergies indicates no known allergies.   Review of Systems Review of Systems  Constitutional: Negative for fatigue and fever.  HENT: Negative for congestion, rhinorrhea, sinus pressure, sneezing and sore throat.   Eyes: Negative for discharge and itching.  Respiratory: Positive for cough, chest tightness, shortness of breath and wheezing.   Cardiovascular: Negative for chest pain and palpitations.  Gastrointestinal: Negative for constipation, diarrhea, nausea and vomiting.  Genitourinary: Negative for dysuria, hematuria, pelvic pain and vaginal bleeding.  Neurological: Negative for light-headedness and headaches.  Psychiatric/Behavioral: Negative for agitation. The patient is not nervous/anxious.      Physical Exam Updated Vital Signs BP 121/77 (BP Location: Right Arm)   Pulse 92   Temp 97.6 F (36.4 C) (Oral)   Resp 22   Ht 5\' 6"  (1.676 m)   Wt  64 kg   LMP 11/17/2015   SpO2 96%   BMI 22.76 kg/m   Physical Exam  Constitutional: She is oriented to person, place, and time. She appears well-developed and well-nourished. No distress.  HENT:  Head: Normocephalic and atraumatic.  Eyes: Conjunctivae are normal. Right eye exhibits no discharge. Left eye exhibits no discharge.  Cardiovascular: Normal rate, regular rhythm and normal heart sounds.   Pulmonary/Chest: No stridor.  No respiratory distress or tachypnea.  Mildly prolonged expiratory phase with mild expiratory in all lung fields.  Abdominal:  Gravid abdomen grossly consistent with dates.  No tenderness.  Musculoskeletal: She exhibits no edema,  tenderness or deformity.  Lymphadenopathy:    She has no cervical adenopathy.  Neurological: She is alert and oriented to person, place, and time.  Skin: Skin is warm and dry.  Psychiatric: She has a normal mood and affect. Her behavior is normal.     ED Treatments / Results  Labs (all labs ordered are listed, but only abnormal results are displayed) Labs Reviewed - No data to display  EKG  EKG Interpretation None       Radiology No results found.  Procedures Procedures (including critical care time)  Medications Ordered in ED Medications  ipratropium-albuterol (DUONEB) 0.5-2.5 (3) MG/3ML nebulizer solution 3 mL (3 mLs Nebulization Given 03/24/16 0910)     Initial Impression / Assessment and Plan / ED Course  I have reviewed the triage vital signs and the nursing notes.  Pertinent labs & imaging results that were available during my care of the patient were reviewed by me and considered in my medical decision making (see chart for details).  Patient with asthma presented with chest tightness, shortness of breath, and wheezing consistent with asthma exacerbation.  Anxiety is unlikely as she is calm, denies anxiety, and is wheezing.  No systemic symptoms or reduced breath sounds on exam to suggest PNA.  Normal EKG in young patient without risk factors for ACS.  No respiratory distress and no prior asthma admissions, will likely not need admission.  Defer CXR in pregnant patient with high suspicion of asthma exacerbation. -Duonebs  Clinical Course  Comment By Time  Improved after Duonebs.  Feels better, reduced wheezing, improved air movement. Alm BustardMatthew O'Sullivan, MD 08/23 [phone]  Still feeling good, wants to leave for OB appointment at 10:30. Alm BustardMatthew O'Sullivan, MD 08/23 [phone]    Final Clinical Impressions(s) / ED Diagnoses   Final diagnoses:  Asthma exacerbation   Pulmonary symptoms greatly improved with 1x duonebs.  Patient moving air well, [facility].  Discharge home with new prescription for albuterol inhaler and PCP follow-up.  New Prescriptions Discharge Medication List as of 03/24/2016 10:40 AM    START taking these medications   Details  albuterol (PROVENTIL HFA;VENTOLIN HFA) 108 (90 Base) MCG/ACT inhaler Inhale 1-2 puffs into the lungs every 6 (six) hours as needed for wheezing or shortness of breath., Starting Wed 03/24/2016, Print         Alm BustardMatthew O'Sullivan, MD 03/24/16 1443    Marily MemosJason Mesner, MD 03/24/16 [phone]

## 2016-03-24 NOTE — ED Triage Notes (Addendum)
Pt reports SOB that began last night. PMH of asthma. States she has used her inhaler with no relief. Pt is [redacted] wks pregnant. EDD 08/20/2016. NAD noted. VSS.

## 2016-10-18 ENCOUNTER — Emergency Department (HOSPITAL_COMMUNITY)
Admission: EM | Admit: 2016-10-18 | Discharge: 2016-10-18 | Disposition: A | Discharge status: Home / Self Care | Payer: Medicaid Other | Attending: Emergency Medicine | Admitting: Emergency Medicine

## 2016-10-18 ENCOUNTER — Encounter (HOSPITAL_COMMUNITY): Payer: Self-pay | Admitting: *Deleted

## 2016-10-18 DIAGNOSIS — F17210 Nicotine dependence, cigarettes, uncomplicated: Secondary | ICD-10-CM | POA: Diagnosis not present

## 2016-10-18 DIAGNOSIS — K047 Periapical abscess without sinus: Secondary | ICD-10-CM

## 2016-10-18 DIAGNOSIS — J45909 Unspecified asthma, uncomplicated: Secondary | ICD-10-CM | POA: Insufficient documentation

## 2016-10-18 DIAGNOSIS — K0889 Other specified disorders of teeth and supporting structures: Secondary | ICD-10-CM | POA: Diagnosis present

## 2016-10-18 MED ORDER — IBUPROFEN 800 MG PO TABS
800.0000 mg | ORAL_TABLET | Freq: Once | ORAL | Status: AC
  Administered 2016-10-18: 800 mg via ORAL
  Filled 2016-10-18: qty 1

## 2016-10-18 MED ORDER — IBUPROFEN 600 MG PO TABS: 600.0000 mg | ORAL_TABLET | Freq: Four times a day (QID) | ORAL | 0 refills | Status: DC | PRN

## 2016-10-18 MED ORDER — AMOXICILLIN 250 MG PO CAPS
500.0000 mg | ORAL_CAPSULE | Freq: Once | ORAL | Status: AC
  Administered 2016-10-18: 500 mg via ORAL
  Filled 2016-10-18: qty 2

## 2016-10-18 MED ORDER — AMOXICILLIN 500 MG PO CAPS: 500.0000 mg | ORAL_CAPSULE | Freq: Three times a day (TID) | ORAL | 0 refills | Status: AC

## 2016-10-18 NOTE — ED Notes (Signed)
Pt alert & oriented x4, stable gait. Patient given discharge instructions, paperwork & prescription(s). Patient  instructed to stop at the registration desk to finish any additional paperwork. Patient verbalized understanding. Pt left department w/ no further questions. 

## 2016-10-18 NOTE — ED Provider Notes (Signed)
[facility] Provider Note   CSN: [id number] Arrival date & time: 10/18/16  0023     History   Chief Complaint Chief Complaint  Patient presents with  . Dental Pain    HPI Jacqueline Velasquez is a 21 y.o. female presenting with a 7 day history of dental pain and gingival swelling.   The patient has a history of injury and/or decay in the tooth involved which has recently started to cause increased  pain.  There has been no fevers, chills, nausea or vomiting, also no complaint of difficulty swallowing, although chewing makes pain worse.  The patient has tried tylenol, aleve, motrin and ice without relief of symptoms.    Marland Kitchen  HPI  Past Medical History:  Diagnosis Date  . Anxiety   . Asthma   . Bipolar 1 disorder (HCC)   . No pertinent past medical history     Patient Active Problem List   Diagnosis Date Noted  . Mood disorder (HCC) 01/24/2012  . Borderline personality disorder 01/24/2012    Past Surgical History:  Procedure Laterality Date  . NO PAST SURGERIES      OB History    Gravida Para Term Preterm AB Living   1             SAB TAB Ectopic Multiple Live Births                   Home Medications    Prior to Admission medications   Medication Sig Start Date End Date Taking? Authorizing Provider  acetaminophen (PAIN & FEVER CHILDRENS) 80 MG chewable tablet Chew 80-160 mg by mouth once as needed for mild pain.    Historical Provider, MD  albuterol (PROVENTIL HFA;VENTOLIN HFA) 108 (90 Base) MCG/ACT inhaler Inhale 1-2 puffs into the lungs every 6 (six) hours as needed for wheezing or shortness of breath. 03/24/16   Alm Bustard, MD  amoxicillin (AMOXIL) 500 MG capsule Take 1 capsule (500 mg total) by mouth 3 (three) times daily. 10/18/16 10/28/16  Burgess Amor, PA-C  ibuprofen (ADVIL,MOTRIN) 600 MG tablet Take 1 tablet (600 mg total) by mouth every 6 (six) hours as needed. 10/18/16   Burgess Amor, PA-C  Prenatal Vit-Fe Fumarate-FA (PRENATAL MULTIVITAMIN) TABS  tablet Take 1 tablet by mouth daily.     Historical Provider, MD    Family History Family History  Problem Relation Age of Onset  . Asthma Mother     Social History Social History  Substance Use Topics  . Smoking status: Current Every Day Smoker    Packs/day: 0.50    Years: 1.00    Types: Cigarettes  . Smokeless tobacco: Never Used  . Alcohol use No     Allergies   Patient has no known allergies.   Review of Systems Review of Systems  Constitutional: Negative for fever.  HENT: Positive for dental problem. Negative for facial swelling and sore throat.   Respiratory: Negative for shortness of breath.   Musculoskeletal: Negative for neck pain and neck stiffness.     Physical Exam Updated Vital Signs BP 125/77 (BP Location: Right Arm)   Pulse 66   Temp 97.5 F (36.4 C) (Oral)   Resp 18   Ht 5\' 6"  (1.676 m)   Wt 85.3 kg   LMP 09/26/2016   SpO2 100%   BMI 30.34 kg/m   Physical Exam  Constitutional: She is oriented to person, place, and time. She appears well-developed and well-nourished. No distress.  HENT:  Head:  Normocephalic and atraumatic.  Right Ear: Tympanic membrane and external ear normal.  Left Ear: Tympanic membrane and external ear normal.  Mouth/Throat: Oropharynx is clear and moist and mucous membranes are normal. No oral lesions. No trismus in the jaw. Dental abscesses present.  Left lower 1st and second molar teeth with decay and lateral gingival swelling, no drainage or fluctuant pocket.  Left upper 3rd molar and right lower molars also with decay but no signs of active infection. No facial erythema or induration.  Sublingual space soft.  Eyes: Conjunctivae are normal.  Neck: Normal range of motion. Neck supple.  Cardiovascular: Normal rate and normal heart sounds.   Pulmonary/Chest: Effort normal.  Musculoskeletal: Normal range of motion.  Lymphadenopathy:    She has no cervical adenopathy.  Neurological: She is alert and oriented to person,  place, and time.  Skin: Skin is warm and dry. No erythema.  Psychiatric: She has a normal mood and affect.     ED Treatments / Results  Labs (all labs ordered are listed, but only abnormal results are displayed) Labs Reviewed - No data to display  EKG  EKG Interpretation None       Radiology No results found.  Procedures Procedures (including critical care time)  Medications Ordered in ED Medications  amoxicillin (AMOXIL) capsule 500 mg (not administered)  ibuprofen (ADVIL,MOTRIN) tablet 800 mg (not administered)     Initial Impression / Assessment and Plan / ED Course  I have reviewed the triage vital signs and the nursing notes.  Pertinent labs & imaging results that were available during my care of the patient were reviewed by me and considered in my medical decision making (see chart for details).     Amoxil, ibuprofen.  f/u with dentist.  Has appt in [place] mid April.  Final Clinical Impressions(s) / ED Diagnoses   Final diagnoses:  Dental infection    New Prescriptions New Prescriptions   AMOXICILLIN (AMOXIL) 500 MG CAPSULE    Take 1 capsule (500 mg total) by mouth 3 (three) times daily.   IBUPROFEN (ADVIL,MOTRIN) 600 MG TABLET    Take 1 tablet (600 mg total) by mouth every 6 (six) hours as needed.     Burgess AmorJulie Emilynn Srinivasan, PA-C 10/18/16 0119    Dione Boozeavid Glick, MD 10/18/16 [phone]

## 2016-10-18 NOTE — Discharge Instructions (Signed)
Complete your entire course of antibiotics as prescribed.   Avoid applying heat or ice to this abscess area which can worsen your symptoms.  You may use warm salt water swish and spit treatment or half peroxide and water swish and spit after meals to keep this area clean as discussed.  Call your dentist  for further management of your symptoms. ° °

## 2016-10-18 NOTE — ED Triage Notes (Signed)
Pt c/o toothache for over a week

## 2017-06-01 ENCOUNTER — Emergency Department (HOSPITAL_COMMUNITY)
Admission: EM | Admit: 2017-06-01 | Discharge: 2017-06-01 | Disposition: A | Discharge status: Home / Self Care | Payer: Self-pay | Attending: Emergency Medicine | Admitting: Emergency Medicine

## 2017-06-01 ENCOUNTER — Encounter (HOSPITAL_COMMUNITY): Payer: Self-pay | Admitting: Emergency Medicine

## 2017-06-01 DIAGNOSIS — Z79899 Other long term (current) drug therapy: Secondary | ICD-10-CM | POA: Insufficient documentation

## 2017-06-01 DIAGNOSIS — F17210 Nicotine dependence, cigarettes, uncomplicated: Secondary | ICD-10-CM | POA: Insufficient documentation

## 2017-06-01 DIAGNOSIS — K0889 Other specified disorders of teeth and supporting structures: Secondary | ICD-10-CM | POA: Insufficient documentation

## 2017-06-01 DIAGNOSIS — J45909 Unspecified asthma, uncomplicated: Secondary | ICD-10-CM | POA: Insufficient documentation

## 2017-06-01 MED ORDER — AMOXICILLIN-POT CLAVULANATE 875-125 MG PO TABS: 1.0000 | ORAL_TABLET | Freq: Two times a day (BID) | ORAL | 0 refills | Status: AC

## 2017-06-01 MED ORDER — IBUPROFEN 800 MG PO TABS: 800.0000 mg | ORAL_TABLET | Freq: Three times a day (TID) | ORAL | 0 refills | Status: DC

## 2017-06-01 MED ORDER — MAGIC MOUTHWASH: 5.0000 mL | Freq: Three times a day (TID) | ORAL | 0 refills | Status: DC | PRN

## 2017-06-01 NOTE — ED Triage Notes (Signed)
Pt reports nine month old hit head on pt's  left jaw. Pt reports swelling and mouth pain ever since. Pt reports history of poor dental hygiene. nad noted. Airway patent.

## 2017-06-01 NOTE — Discharge Instructions (Signed)
As discussed, take your entire course of antibiotics even if you feel better. Stay well-hydrated. Ibuprofen for pain and Magic mouthwash as needed 3 times a day.  Follow-up with your dentist at your upcoming appointment. Return if you experience worsening of swelling, tongue swelling, difficulty swallowing, difficulty breathing or any other new concerning symptoms in the meantime.
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
## 2017-09-10 IMAGING — DX DG CHEST 2V
2 series · 2 of 2 positions shown · non-contrast
Comparison: Chest radiograph performed 01/31/2014

CLINICAL DATA: Acute onset of wheezing, congestion and productive
cough. Initial encounter.

EXAM:
CHEST  2 VIEW

[chest pa]
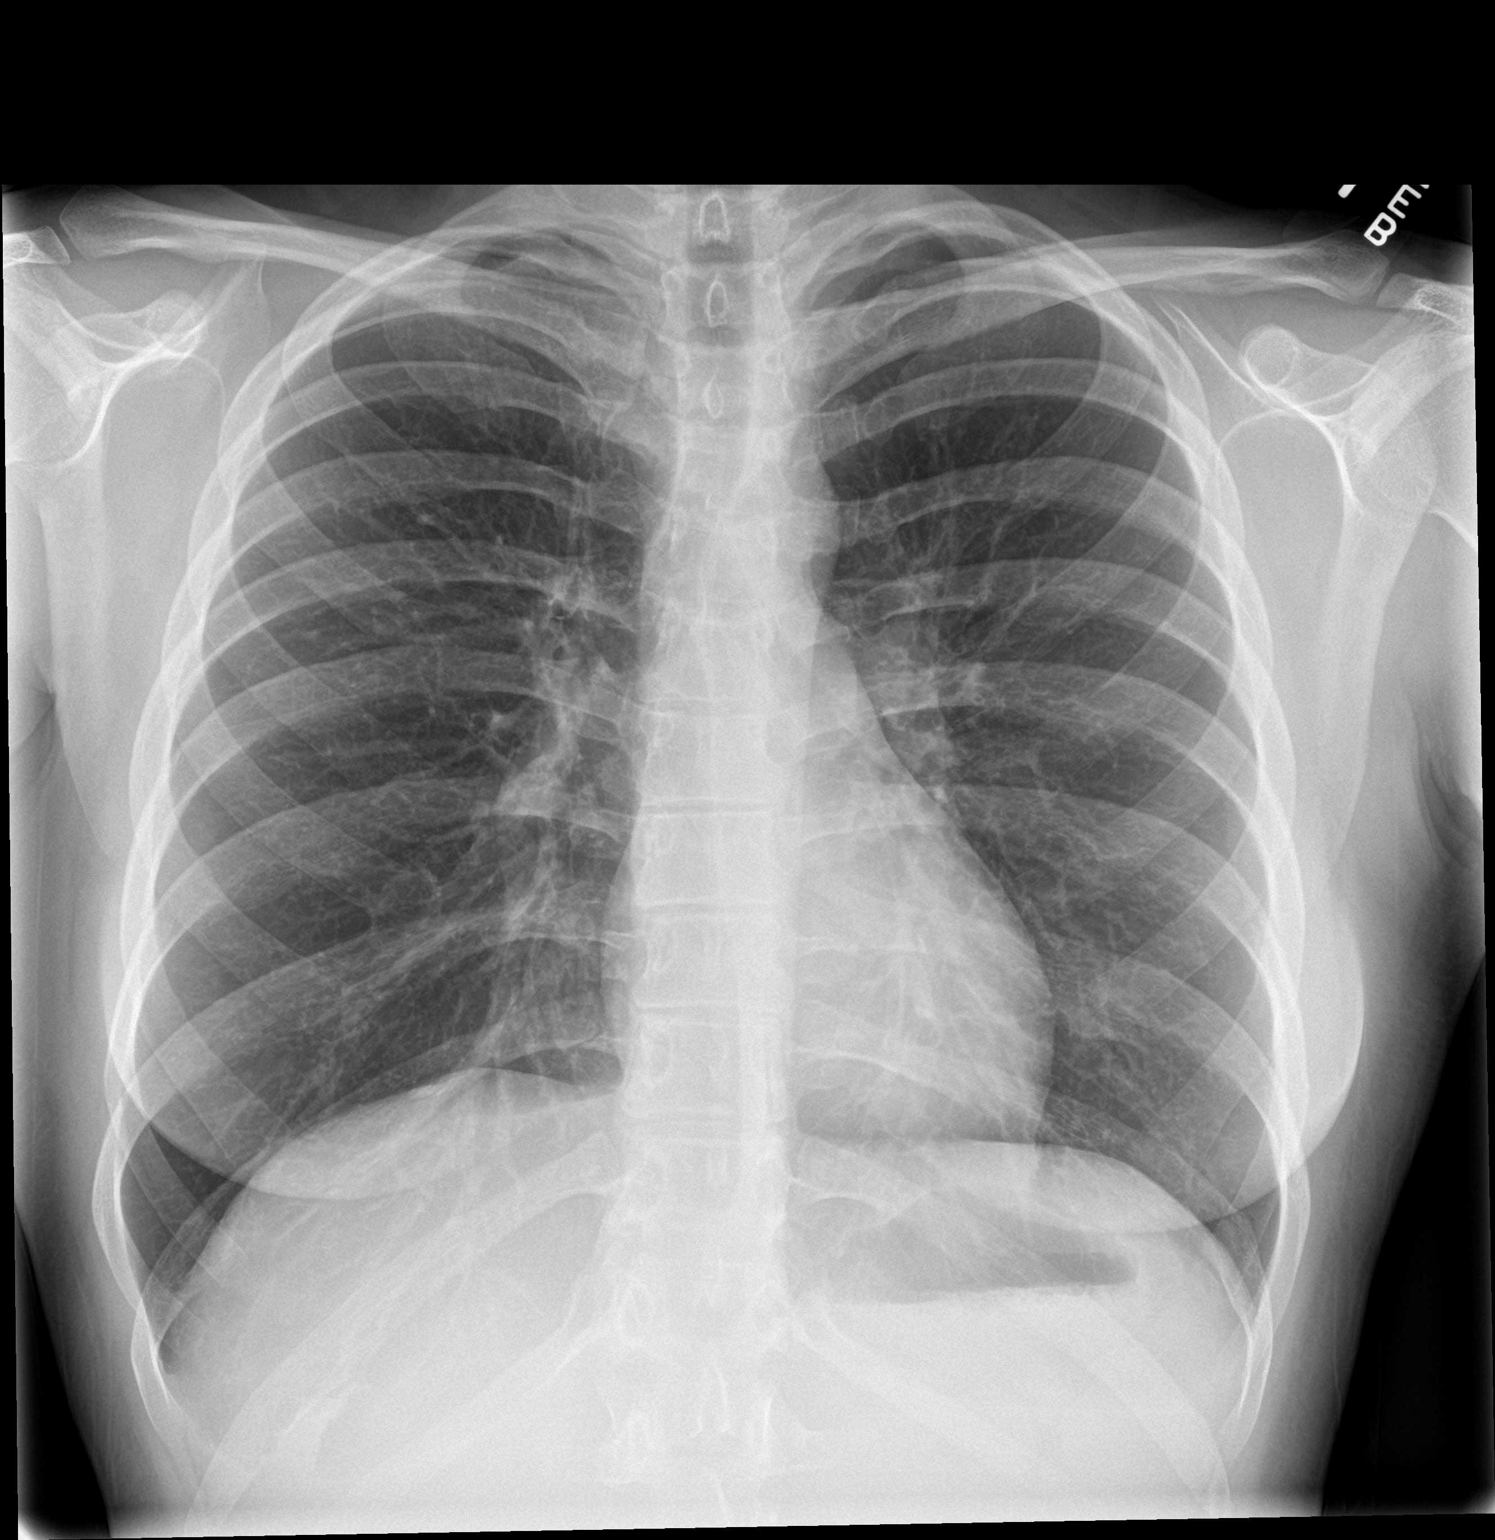

[chest lat]
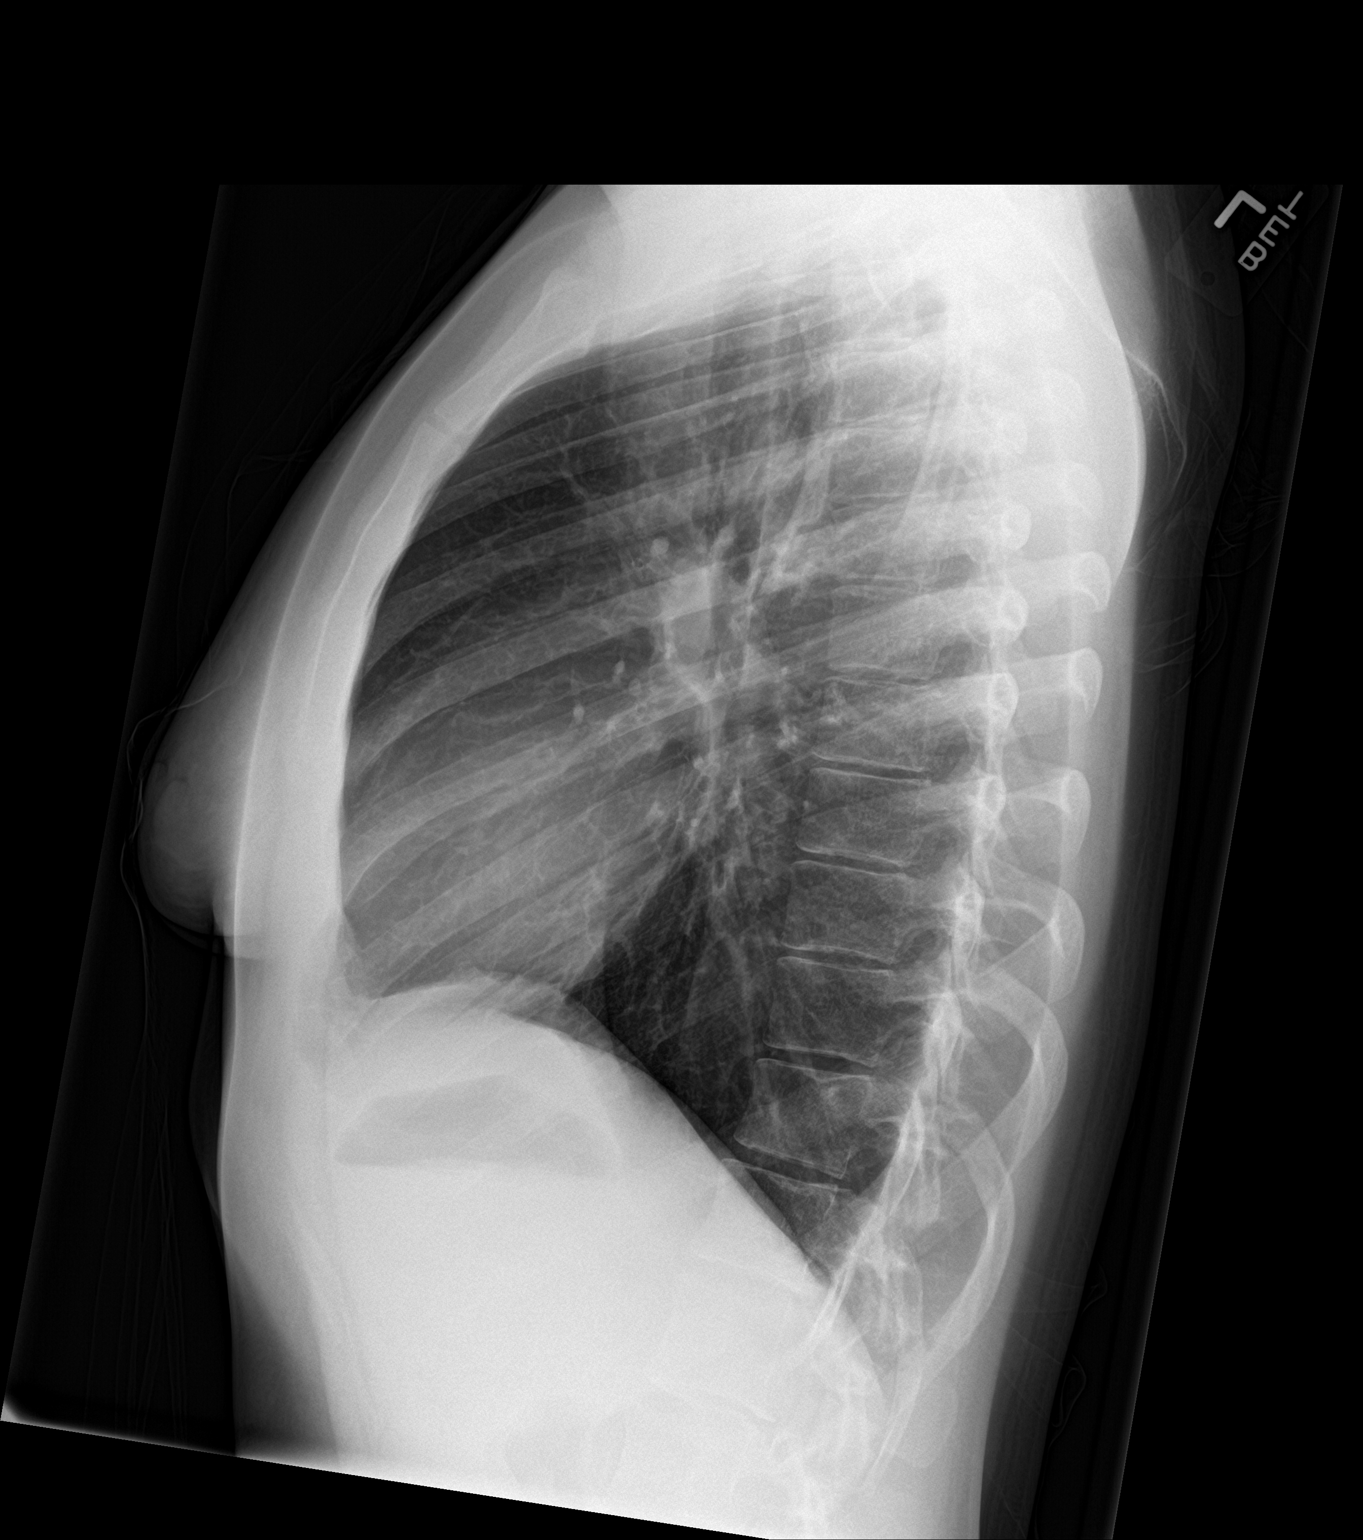

[2 of 2 positions shown; findings below may reference images not displayed]

FINDINGS: The lungs are well-aerated and clear. There is no evidence of focal
opacification, pleural effusion or pneumothorax.

The heart is normal in size; the mediastinal contour is within
normal limits. No acute osseous abnormalities are seen.
IMPRESSION: No acute cardiopulmonary process seen.

## 2017-09-15 ENCOUNTER — Encounter (HOSPITAL_COMMUNITY): Payer: Self-pay | Admitting: Emergency Medicine

## 2017-09-15 ENCOUNTER — Other Ambulatory Visit: Payer: Self-pay

## 2017-09-15 ENCOUNTER — Emergency Department (HOSPITAL_COMMUNITY)
Admission: EM | Admit: 2017-09-15 | Discharge: 2017-09-15 | Disposition: A | Discharge status: Home / Self Care | Payer: Self-pay | Attending: Emergency Medicine | Admitting: Emergency Medicine

## 2017-09-15 DIAGNOSIS — K029 Dental caries, unspecified: Secondary | ICD-10-CM

## 2017-09-15 DIAGNOSIS — F17210 Nicotine dependence, cigarettes, uncomplicated: Secondary | ICD-10-CM | POA: Insufficient documentation

## 2017-09-15 DIAGNOSIS — K0889 Other specified disorders of teeth and supporting structures: Secondary | ICD-10-CM | POA: Insufficient documentation

## 2017-09-15 DIAGNOSIS — J45909 Unspecified asthma, uncomplicated: Secondary | ICD-10-CM | POA: Insufficient documentation

## 2017-09-15 MED ORDER — IBUPROFEN 800 MG PO TABS
800.0000 mg | ORAL_TABLET | Freq: Once | ORAL | Status: AC
  Administered 2017-09-15: 800 mg via ORAL
  Filled 2017-09-15: qty 1

## 2017-09-15 MED ORDER — PROMETHAZINE HCL 12.5 MG PO TABS
12.5000 mg | ORAL_TABLET | Freq: Once | ORAL | Status: AC
  Administered 2017-09-15: 12.5 mg via ORAL
  Filled 2017-09-15: qty 1

## 2017-09-15 MED ORDER — CLINDAMYCIN HCL 150 MG PO CAPS
300.0000 mg | ORAL_CAPSULE | Freq: Once | ORAL | Status: AC
  Administered 2017-09-15: 300 mg via ORAL
  Filled 2017-09-15: qty 2

## 2017-09-15 MED ORDER — TRAMADOL HCL 50 MG PO TABS
100.0000 mg | ORAL_TABLET | Freq: Once | ORAL | Status: AC
  Administered 2017-09-15: 100 mg via ORAL
  Filled 2017-09-15: qty 2

## 2017-09-15 MED ORDER — CLINDAMYCIN HCL 150 MG PO CAPS: 150.0000 mg | ORAL_CAPSULE | Freq: Four times a day (QID) | ORAL | 0 refills | Status: DC

## 2017-09-15 MED ORDER — IBUPROFEN 600 MG PO TABS: 600.0000 mg | ORAL_TABLET | Freq: Four times a day (QID) | ORAL | 0 refills | Status: DC

## 2017-09-15 MED ORDER — PROMETHAZINE HCL 12.5 MG PO TABS: 12.5000 mg | ORAL_TABLET | Freq: Four times a day (QID) | ORAL | 0 refills | Status: DC | PRN

## 2017-09-15 NOTE — ED Provider Notes (Signed)
[facility] EMERGENCY DEPARTMENT Provider Note   CSN: [id number] Arrival date & time: 09/15/17  1723     History   Chief Complaint Chief Complaint  Patient presents with  . Dental Pain    HPI Jacqueline Velasquez is a 22 y.o. female.  The history is provided by the patient.  Dental Pain   This is a chronic problem. The current episode started more than 1 week ago. The problem occurs daily. The problem has been gradually worsening. The pain is moderate. She has tried acetaminophen and aspirin for the symptoms.    Past Medical History:  Diagnosis Date  . Anxiety   . Asthma   . Bipolar 1 disorder (HCC)   . No pertinent past medical history     Patient Active Problem List   Diagnosis Date Noted  . Mood disorder (HCC) 01/24/2012  . Borderline personality disorder (HCC) 01/24/2012    Past Surgical History:  Procedure Laterality Date  . NO PAST SURGERIES      OB History    Gravida Para Term Preterm AB Living   1         1   SAB TAB Ectopic Multiple Live Births                   Home Medications    Prior to Admission medications   Medication Sig Start Date End Date Taking? Authorizing Provider  acetaminophen (PAIN & FEVER CHILDRENS) 80 MG chewable tablet Chew 80-160 mg by mouth once as needed for mild pain.    [provider]  albuterol (PROVENTIL HFA;VENTOLIN HFA) 108 (90 Base) MCG/ACT inhaler Inhale 1-2 puffs into the lungs every 6 (six) hours as needed for wheezing or shortness of breath. 03/24/16   Alm Bustard, MD  ibuprofen (ADVIL,MOTRIN) 800 MG tablet Take 1 tablet (800 mg total) by mouth 3 (three) times daily. 06/01/17   Mathews Robinsons B, PA-C  magic mouthwash SOLN Take 5 mLs by mouth 3 (three) times daily as needed for mouth pain. 06/01/17   Georgiana Shore, PA-C  Prenatal Vit-Fe Fumarate-FA (PRENATAL MULTIVITAMIN) TABS tablet Take 1 tablet by mouth daily.     [provider]    Family History Family History  Problem Relation  Age of Onset  . Asthma Mother     Social History Social History   Tobacco Use  . Smoking status: Current Some Day Smoker    Packs/day: 0.25    Years: 1.00    Pack years: 0.25    Types: Cigarettes  . Smokeless tobacco: Never Used  Substance Use Topics  . Alcohol use: No  . Drug use: No    Comment: denies     Allergies   Patient has no known allergies.   Review of Systems Review of Systems  Constitutional: Negative for activity change.       All ROS Neg except as noted in HPI  HENT: Positive for dental problem. Negative for nosebleeds.   Eyes: Negative for photophobia and discharge.  Respiratory: Negative for cough, shortness of breath and wheezing.   Cardiovascular: Negative for chest pain and palpitations.  Gastrointestinal: Negative for abdominal pain and blood in stool.  Genitourinary: Negative for dysuria, frequency and hematuria.  Musculoskeletal: Negative for arthralgias, back pain and neck pain.  Skin: Negative.   Neurological: Negative for dizziness, seizures and speech difficulty.  Psychiatric/Behavioral: Negative for confusion and hallucinations.     Physical Exam Updated Vital Signs BP 124/79 (BP Location: Right Arm)  Pulse 87   Temp 98.1 F (36.7 C) (Oral)   Resp 16   Ht 5\' 6"  (1.676 m)   Wt 65.8 kg (145 lb)   LMP 09/08/2017   SpO2 100%   BMI 23.40 kg/m   Physical Exam  Constitutional: She is oriented to person, place, and time. She appears well-developed and well-nourished.  Non-toxic appearance.  HENT:  Head: Normocephalic.  Right Ear: Tympanic membrane and external ear normal.  Left Ear: Tympanic membrane and external ear normal.  Mouth/Throat: Uvula is midline, oropharynx is clear and moist and mucous membranes are normal. Abnormal dentition. Dental caries present. No uvula swelling.  No swelling under the tongue. Airway patent. Left upper gum swelling, but no visible abscess. Multiple dental caries.  Eyes: EOM and lids are normal.  Pupils are equal, round, and reactive to light.  Neck: Normal range of motion. Neck supple. Carotid bruit is not present.  Cardiovascular: Normal rate, regular rhythm, normal heart sounds, intact distal pulses and normal pulses.  Pulmonary/Chest: Breath sounds normal. No respiratory distress.  Abdominal: Soft. Bowel sounds are normal. There is no tenderness. There is no guarding.  Musculoskeletal: Normal range of motion.  Lymphadenopathy:       Head (right side): No submandibular adenopathy present.       Head (left side): No submandibular adenopathy present.    She has no cervical adenopathy.  Neurological: She is alert and oriented to person, place, and time. She has normal strength. No cranial nerve deficit or sensory deficit.  Skin: Skin is warm and dry.  Psychiatric: She has a normal mood and affect. Her speech is normal.  Nursing note and vitals reviewed.    ED Treatments / Results  Labs (all labs ordered are listed, but only abnormal results are displayed) Labs Reviewed - No data to display  EKG  EKG Interpretation None       Radiology No results found.  Procedures Procedures (including critical care time)  Medications Ordered in ED Medications - No data to display   Initial Impression / Assessment and Plan / ED Course  I have reviewed the triage vital signs and the nursing notes.  Pertinent labs & imaging results that were available during my care of the patient were reviewed by me and considered in my medical decision making (see chart for details).       Final Clinical Impressions(s) / ED Diagnoses MDM Vital signs reviewed.  Patient has multiple dental caries.  She has been battling with dental problems for several months.  She is scheduled for a dental appointment in the near future.  There is noted some swelling of the left gum.  There is no evidence for Ludewig's angina.  No airway compromise or other emergent changes noted on exam.  Patient will be  treated with clindamycin and ibuprofen.  The patient complains of some nausea, and she is given a few tablets of promethazine to assist with this symptom.   Final diagnoses:  Dental caries    ED Discharge Orders        Ordered    clindamycin (CLEOCIN) 150 MG capsule  Every 6 hours     09/15/17 1918    ibuprofen (ADVIL,MOTRIN) 600 MG tablet  4 times daily     09/15/17 1918    promethazine (PHENERGAN) 12.5 MG tablet  Every 6 hours PRN     09/15/17 1918       Ivery QualeBryant, [place] Gamero, PA-C 09/15/17 Rowland [id number]    Zammit, Joseph, MD 09/15/17 2342

## 2017-09-15 NOTE — Discharge Instructions (Signed)
Use clindamycin and ibuprofen 4 times daily with food.  Please see your dentist as soon as possible.  Use promethazine every 6 hours if needed for nausea.  This medication may cause drowsiness, please use it with caution.  See your dentist as soon as possible.

## 2017-09-15 NOTE — ED Triage Notes (Signed)
PT c/o left upper dental pain over the past few weeks but worsening x1 day. PT states she has a dentist appt in 2 weeks.

## 2018-09-04 ENCOUNTER — Other Ambulatory Visit: Payer: Self-pay

## 2018-09-04 ENCOUNTER — Emergency Department (HOSPITAL_COMMUNITY): Payer: Medicaid Other

## 2018-09-04 ENCOUNTER — Emergency Department (HOSPITAL_COMMUNITY)
Admission: EM | Admit: 2018-09-04 | Discharge: 2018-09-04 | Disposition: A | Discharge status: Home / Self Care | Payer: Medicaid Other | Attending: Emergency Medicine | Admitting: Emergency Medicine

## 2018-09-04 ENCOUNTER — Encounter (HOSPITAL_COMMUNITY): Payer: Self-pay | Admitting: *Deleted

## 2018-09-04 DIAGNOSIS — F17210 Nicotine dependence, cigarettes, uncomplicated: Secondary | ICD-10-CM | POA: Insufficient documentation

## 2018-09-04 DIAGNOSIS — J45909 Unspecified asthma, uncomplicated: Secondary | ICD-10-CM | POA: Insufficient documentation

## 2018-09-04 DIAGNOSIS — T401X1A Poisoning by heroin, accidental (unintentional), initial encounter: Secondary | ICD-10-CM | POA: Insufficient documentation

## 2018-09-04 DIAGNOSIS — T40601A Poisoning by unspecified narcotics, accidental (unintentional), initial encounter: Secondary | ICD-10-CM

## 2018-09-04 HISTORY — DX: Borderline personality disorder: F60.3

## 2018-09-04 LAB — COMPREHENSIVE METABOLIC PANEL
ALT: 15 U/L (ref 0–44)
AST: 28 U/L (ref 15–41)
Albumin: 3.9 g/dL (ref 3.5–5.0)
Alkaline Phosphatase: 59 U/L (ref 38–126)
Anion gap: 8 (ref 5–15)
BUN: 17 mg/dL (ref 6–20)
CHLORIDE: 106 mmol/L (ref 98–111)
CO2: 22 mmol/L (ref 22–32)
Calcium: 8.5 mg/dL — ABNORMAL LOW (ref 8.9–10.3)
Creatinine, Ser: 1.03 mg/dL — ABNORMAL HIGH (ref 0.44–1.00)
GFR calc non Af Amer: >60 mL/min (ref >60)
Glucose, Bld: 299 mg/dL — ABNORMAL HIGH (ref 70–99)
Potassium: 3.6 mmol/L (ref 3.5–5.1)
Sodium: 136 mmol/L (ref 135–145)
Total Bilirubin: 1.1 mg/dL (ref 0.3–1.2)
Total Protein: 6.7 g/dL (ref 6.5–8.1)

## 2018-09-04 LAB — CBC
HCT: 36.8 % (ref 36.0–46.0)
Hemoglobin: 11 g/dL — ABNORMAL LOW (ref 12.0–15.0)
MCH: 25.6 pg — ABNORMAL LOW (ref 26.0–34.0)
MCHC: 29.9 g/dL — ABNORMAL LOW (ref 30.0–36.0)
MCV: 85.8 fL (ref 80.0–100.0)
Platelets: 346 10*3/uL (ref 150–400)
RBC: 4.29 MIL/uL (ref 3.87–5.11)
RDW: 13.9 % (ref 11.5–15.5)
WBC: 7 10*3/uL (ref 4.0–10.5)
nRBC: 0 % (ref 0.0–0.2)

## 2018-09-04 LAB — CBG MONITORING, ED: GLUCOSE-CAPILLARY: 276 mg/dL — AB (ref 70–99)

## 2018-09-04 MED ORDER — NALOXONE HCL 0.4 MG/ML IJ SOLN
0.4000 mg | Freq: Once | INTRAMUSCULAR | Status: AC
  Administered 2018-09-04: 0.4 mg via INTRAVENOUS

## 2018-09-04 MED ORDER — SODIUM CHLORIDE 0.9 % IV SOLN
Freq: Once | INTRAVENOUS | Status: AC
  Administered 2018-09-04: 06:00:00 via INTRAVENOUS

## 2018-09-04 MED ORDER — NALOXONE HCL 0.4 MG/ML IJ SOLN
INTRAMUSCULAR | Status: AC
  Filled 2018-09-04: qty 1

## 2018-09-04 NOTE — ED Notes (Signed)
Patient states she snorted heroin last night and "that was my first time doing that." Denies SI/HI. Gave patient name and number of [employer] [facility] [employer] as given to me by Child psychotherapist. Patient verbalized understanding. Discharged to waiting room to wait for ride home.

## 2018-09-04 NOTE — ED Provider Notes (Signed)
Pt reassessed. Mildly drowsy but opens eyes to conversational voice. Answering questions appropriately. Asking for water and then drank some w/o difficulty. At this point I feel she is medically safe for discharge. Hyperglycemia likely related to overdose. Needs to have repeat blood work in the next week.    Raeford Razor, MD 09/04/18 [phone]

## 2018-09-04 NOTE — Discharge Instructions (Addendum)
Your blood glucose (sugar) will rise when during times of physiologic stress.  Your blood glucose was high today but this is probably because you almost killed yourself. I recommend you don't do drugs and have repeat blood work within the next week.

## 2018-09-04 NOTE — ED Notes (Signed)
Per friends/family pt took heroin, meth and lorazepam

## 2018-09-04 NOTE — ED Provider Notes (Signed)
[facility] EMERGENCY DEPARTMENT Provider Note   CSN: [id number] Arrival date & time: 09/04/18  0457     History   Chief Complaint Chief Complaint  Patient presents with  . Drug Overdose    HPI Jacqueline Velasquez is a 23 y.o. female.  HPI  23 year old female comes in with chief complaint of overdose. Patient states that she used heroin for the first time today.  According to the triage nurse, patient was brought here by friends who reported that they gave her Narcan.  She had snorted the heroin and not injected. patient is slightly confused.  She denies any SI, HI.  She also denies any chest pain, abdominal pain, severe headaches.   Past Medical History:  Diagnosis Date  . Anxiety   . Asthma   . Bipolar 1 disorder (HCC)   . Borderline personality disorder (HCC)   . No pertinent past medical history     Patient Active Problem List   Diagnosis Date Noted  . Mood disorder (HCC) 01/24/2012  . Borderline personality disorder (HCC) 01/24/2012    Past Surgical History:  Procedure Laterality Date  . NO PAST SURGERIES       OB History    Gravida  1   Para      Term      Preterm      AB      Living  1     SAB      TAB      Ectopic      Multiple      Live Births               Home Medications    Prior to Admission medications   Medication Sig Start Date End Date Taking? Authorizing Provider  acetaminophen (PAIN & FEVER CHILDRENS) 80 MG chewable tablet Chew 80-160 mg by mouth once as needed for mild pain.    [provider]  albuterol (PROVENTIL HFA;VENTOLIN HFA) 108 (90 Base) MCG/ACT inhaler Inhale 1-2 puffs into the lungs every 6 (six) hours as needed for wheezing or shortness of breath. 03/24/16   Alm Bustard, MD  clindamycin (CLEOCIN) 150 MG capsule Take 1 capsule (150 mg total) by mouth every 6 (six) hours. 09/15/17   Ivery Quale, PA-C  ibuprofen (ADVIL,MOTRIN) 600 MG tablet Take 1 tablet (600 mg total) by mouth 4 (four) times  daily. 09/15/17   Ivery Quale, PA-C  magic mouthwash SOLN Take 5 mLs by mouth 3 (three) times daily as needed for mouth pain. 06/01/17   Georgiana Shore, PA-C  Prenatal Vit-Fe Fumarate-FA (PRENATAL MULTIVITAMIN) TABS tablet Take 1 tablet by mouth daily.     [provider]  promethazine (PHENERGAN) 12.5 MG tablet Take 1 tablet (12.5 mg total) by mouth every 6 (six) hours as needed for nausea. 09/15/17   Ivery Quale, PA-C    Family History Family History  Problem Relation Age of Onset  . Asthma Mother     Social History Social History   Tobacco Use  . Smoking status: Current Some Day Smoker    Packs/day: 0.25    Years: 1.00    Pack years: 0.25    Types: Cigarettes  . Smokeless tobacco: Never Used  Substance Use Topics  . Alcohol use: No  . Drug use: Yes    Types: Marijuana    Comment: heroin     Allergies   Patient has no known allergies.   Review of Systems Review of Systems  Constitutional: Positive  for activity change.  Respiratory: Negative for shortness of breath.   Cardiovascular: Negative for chest pain.  Gastrointestinal: Negative for abdominal pain.  Neurological: Negative for headaches.  All other systems reviewed and are negative.    Physical Exam Updated Vital Signs BP (!) 93/59   Pulse 79   Resp 10   Ht 5\' 2"  (1.575 m)   LMP 09/04/2018   SpO2 100%   BMI 26.52 kg/m   Physical Exam Vitals signs and nursing note reviewed.  Constitutional:      General: She is not in acute distress.    Appearance: She is well-developed. She is ill-appearing. She is not diaphoretic.  HENT:     Head: Atraumatic.  Eyes:     Extraocular Movements: Extraocular movements intact.     Pupils: Pupils are equal, round, and reactive to light.  Neck:     Musculoskeletal: Normal range of motion and neck supple.  Cardiovascular:     Rate and Rhythm: Normal rate.  Pulmonary:     Effort: Pulmonary effort is normal.  Abdominal:     General: Bowel sounds  are normal.  Skin:    General: Skin is warm and dry.  Neurological:     Mental Status: She is oriented to person, place, and time.      ED Treatments / Results  Labs (all labs ordered are listed, but only abnormal results are displayed) Labs Reviewed  CBC - Abnormal; Notable for the following components:      Result Value   Hemoglobin 11.0 (*)    MCH 25.6 (*)    MCHC 29.9 (*)    All other components within normal limits  COMPREHENSIVE METABOLIC PANEL - Abnormal; Notable for the following components:   Glucose, Bld 299 (*)    Creatinine, Ser 1.03 (*)    Calcium 8.5 (*)    All other components within normal limits  CBG MONITORING, ED - Abnormal; Notable for the following components:   Glucose-Capillary 276 (*)    All other components within normal limits    EKG EKG Interpretation  Date/Time:  Monday September 04 2018 05:12:26 EST Ventricular Rate:  94 PR Interval:    QRS Duration: 91 QT Interval:  367 QTC Calculation: 459 R Axis:   70 Text Interpretation:  Sinus rhythm No acute changes NORMAL INTERVALS Confirmed by Derwood Kaplan ([id number]) on 09/04/2018 7:28:53 AM   Radiology No results found.  Procedures .Critical Care Performed by: Derwood Kaplan, MD Authorized by: Derwood Kaplan, MD   Critical care provider statement:    Critical care time (minutes):  45   Critical care was necessary to treat or prevent imminent or life-threatening deterioration of the following conditions:  Toxidrome and respiratory failure   Critical care was time spent personally by me on the following activities:  Discussions with consultants, evaluation of patient's response to treatment, examination of patient, ordering and performing treatments and interventions, ordering and review of laboratory studies, ordering and review of radiographic studies, pulse oximetry, re-evaluation of patient's condition, obtaining history from patient or surrogate and review of old charts   (including  critical care time)  Medications Ordered in ED Medications  0.9 %  sodium chloride infusion ( Intravenous Stopped 09/04/18 0935)  naloxone (NARCAN) injection 0.4 mg (0.4 mg Intravenous Given 09/04/18 0639)     Initial Impression / Assessment and Plan / ED Course  I have reviewed the triage vital signs and the nursing notes.  Pertinent labs & imaging results that were available during  my care of the patient were reviewed by me and considered in my medical decision making (see chart for details).    Patient came to the ER with chief complaint of unresponsiveness.  Patient admits to have snorted heroin, and she was given Narcan in route by her friends. She appears pale and ill-appearing at arrival.  She is confused and dazed but oriented x3.  She does not have severe respiratory depression.  We will place patient on end-tidal CO2 monitor along with O2 monitor and also cardiac monitor.  Her blood pressure dropped later into her stay.  The drop in blood pressure could be physiologic for a young healthy person early in the morning, and so we give IV fluid and noted that patient's blood pressure responded.   Patient reassessed at 8:00.  Incoming team to follow-up.  If patient does not have any decline then she should be able to go home around 9 AM.  Final Clinical Impressions(s) / ED Diagnoses   Final diagnoses:  Opiate overdose, accidental or unintentional, initial encounter [facility](HCC)    ED Discharge Orders    None       Derwood KaplanNanavati, Santos Sollenberger, MD 09/06/18 [phone]

## 2018-09-04 NOTE — ED Triage Notes (Signed)
Registration called stating pt at front desk with drug overdose that has been given Narcan x 2 doses prior to arrival, pt drowsy but alert, pt reports she took Heroin and last consumed at 0300

## 2019-03-26 ENCOUNTER — Observation Stay (HOSPITAL_COMMUNITY)
Admission: RE | Admit: 2019-03-26 | Discharge: 2019-03-27 | Disposition: A | Discharge status: Home / Self Care | Payer: Medicaid Other | Attending: Psychiatry | Admitting: Psychiatry

## 2019-03-26 DIAGNOSIS — R45851 Suicidal ideations: Secondary | ICD-10-CM | POA: Diagnosis not present

## 2019-03-26 DIAGNOSIS — Z20828 Contact with and (suspected) exposure to other viral communicable diseases: Secondary | ICD-10-CM | POA: Insufficient documentation

## 2019-03-26 DIAGNOSIS — F339 Major depressive disorder, recurrent, unspecified: Secondary | ICD-10-CM | POA: Diagnosis present

## 2019-03-26 DIAGNOSIS — F332 Major depressive disorder, recurrent severe without psychotic features: Secondary | ICD-10-CM | POA: Diagnosis present

## 2019-03-26 DIAGNOSIS — F419 Anxiety disorder, unspecified: Secondary | ICD-10-CM | POA: Diagnosis not present

## 2019-03-26 DIAGNOSIS — J45909 Unspecified asthma, uncomplicated: Secondary | ICD-10-CM | POA: Insufficient documentation

## 2019-03-26 DIAGNOSIS — Z79899 Other long term (current) drug therapy: Secondary | ICD-10-CM | POA: Diagnosis not present

## 2019-03-26 DIAGNOSIS — G4700 Insomnia, unspecified: Secondary | ICD-10-CM | POA: Diagnosis not present

## 2019-03-26 DIAGNOSIS — F319 Bipolar disorder, unspecified: Secondary | ICD-10-CM | POA: Diagnosis not present

## 2019-03-26 DIAGNOSIS — F603 Borderline personality disorder: Secondary | ICD-10-CM | POA: Diagnosis present

## 2019-03-26 DIAGNOSIS — Z560 Unemployment, unspecified: Secondary | ICD-10-CM | POA: Diagnosis not present
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
## 2019-03-27 ENCOUNTER — Other Ambulatory Visit: Payer: Self-pay

## 2019-03-27 ENCOUNTER — Ambulatory Visit (HOSPITAL_COMMUNITY): Payer: Self-pay

## 2019-03-27 ENCOUNTER — Encounter (HOSPITAL_COMMUNITY): Payer: Self-pay | Admitting: Emergency Medicine

## 2019-03-27 DIAGNOSIS — F603 Borderline personality disorder: Secondary | ICD-10-CM

## 2019-03-27 DIAGNOSIS — F319 Bipolar disorder, unspecified: Secondary | ICD-10-CM | POA: Diagnosis not present

## 2019-03-27 DIAGNOSIS — F332 Major depressive disorder, recurrent severe without psychotic features: Secondary | ICD-10-CM | POA: Diagnosis present

## 2019-03-27 LAB — SARS CORONAVIRUS 2 BY RT PCR (HOSPITAL ORDER, PERFORMED IN ~~LOC~~ HOSPITAL LAB): SARS Coronavirus 2: NEGATIVE

## 2019-03-27 MED ORDER — TRAZODONE HCL 50 MG PO TABS
50.0000 mg | ORAL_TABLET | Freq: Every day | ORAL | Status: DC
  Administered 2019-03-27: 50 mg via ORAL
  Filled 2019-03-27: qty 1

## 2019-03-27 MED ORDER — ALUM & MAG HYDROXIDE-SIMETH 200-200-20 MG/5ML PO SUSP: 30.0000 mL | ORAL | Status: DC | PRN

## 2019-03-27 MED ORDER — LORAZEPAM 1 MG PO TABS
1.0000 mg | ORAL_TABLET | Freq: Once | ORAL | Status: AC
  Administered 2019-03-27: 1 mg via ORAL
  Filled 2019-03-27: qty 1

## 2019-03-27 MED ORDER — HYDROXYZINE HCL 25 MG PO TABS: 25.0000 mg | ORAL_TABLET | Freq: Three times a day (TID) | ORAL | Status: DC | PRN

## 2019-03-27 MED ORDER — ACETAMINOPHEN 325 MG PO TABS: 650.0000 mg | ORAL_TABLET | Freq: Four times a day (QID) | ORAL | Status: DC | PRN

## 2019-03-27 MED ORDER — LORAZEPAM 1 MG PO TABS: 1.0000 mg | ORAL_TABLET | Freq: Four times a day (QID) | ORAL | Status: DC | PRN

## 2019-03-27 MED ORDER — NICOTINE 14 MG/24HR TD PT24
14.0000 mg | MEDICATED_PATCH | Freq: Every day | TRANSDERMAL | Status: DC
  Administered 2019-03-27: 14 mg via TRANSDERMAL
  Filled 2019-03-27: qty 1

## 2019-03-27 MED ORDER — MAGNESIUM HYDROXIDE 400 MG/5ML PO SUSP: 30.0000 mL | Freq: Every day | ORAL | Status: DC | PRN

## 2019-03-27 NOTE — Consult Note (Addendum)
[facility]  03/27/2019 11:22 AM Jacqueline Velasquez  MRN:  [id number] Principal Problem: Borderline personality disorder [facility](HCC) Discharge Diagnoses: Principal Problem:   Borderline personality disorder (HCC) Active Problems:   Major depressive disorder, recurrent episode, severe (HCC)   Severe recurrent major depression without psychotic features (HCC)   Subjective: Jacqueline GuthrieSierra B Velasquez, 23 y.o., female patient seen via tele psych by this provider, Dr. Sharma CovertNorman; and chart reviewed on 03/27/19.  On evaluation Jacqueline GuthrieSierra B Velasquez reports that she has a history of borderline personality disorder and that she has been feeling depressed about getting her life together related to her drug use.  Patient states that she has a 382 yr old daughter and the father is working with her to get her life together.  Patient states that she lives with her mother and father and that her daughter is with the father right now.  Patient states that she would like to get into a DBT therapy because of a friend who did it did really well after finishing.  Patient denies suicidal/self-harm/homicidal ideation, psychosis, and paranoia.  Patient does report prior psychiatric hospitalization 2013; reports prior suicide attempts and history of self harming behavior.  States it has been doing better until 3 weeks ago when self harm (cutting).   During evaluation Jacqueline Velasquez is alert/oriented x 4; calm/cooperative; and mood is congruent with affect.  She does not appear to be responding to internal/external stimuli or delusional thoughts.  Patient denies suicidal/self-harm/homicidal ideation, psychosis, and paranoia.  Patient answered question appropriately.     Total Time spent with patient: 30 minutes  Past Psychiatric History: Borderline personality disorder; depression  Past Medical History:  Past Medical History:  Diagnosis Date  . Anxiety   . Asthma   . Bipolar 1 disorder (HCC)   . Borderline personality  disorder (HCC)   . No pertinent past medical history     Past Surgical History:  Procedure Laterality Date  . NO PAST SURGERIES     Family History:  Family History  Problem Relation Age of Onset  . Asthma Mother    Family Psychiatric  History: Denies Social History:  Social History   Substance and Sexual Activity  Alcohol Use No     Social History   Substance and Sexual Activity  Drug Use Yes  . Types: Marijuana   Comment: heroin    Social History   Socioeconomic History  . Marital status: Single    Spouse name: Not on file  . Number of children: Not on file  . Years of education: 7210  . Highest education level: Not on file  Occupational History  . Not on file  Social Needs  . Financial resource strain: Not on file  . Food insecurity    Worry: Not on file    Inability: Not on file  . Transportation needs    Medical: Not on file    Non-medical: Not on file  Tobacco Use  . Smoking status: Current Some Day Smoker    Packs/day: 0.25    Years: 1.00    Pack years: 0.25    Types: Cigarettes  . Smokeless tobacco: Never Used  Substance and Sexual Activity  . Alcohol use: No  . Drug use: Yes    Types: Marijuana    Comment: heroin  . Sexual activity: Yes    Birth control/protection: None  Lifestyle  . Physical activity    Days per week: Not on file    Minutes per session:  Not on file  . Stress: Not on file  Relationships  . Social Musician on phone: Not on file    Gets together: Not on file    Attends religious service: Not on file    Active member of club or organization: Not on file    Attends meetings of clubs or organizations: Not on file    Relationship status: Not on file  Other Topics Concern  . Not on file  Social History Narrative  . Not on file    Has this patient used any form of tobacco in the last 30 days? (Cigarettes, Smokeless Tobacco, Cigars, and/or Pipes) A prescription for an FDA-approved tobacco cessation medication was  offered at discharge and the patient refused  Current Medications: Current Facility-Administered Medications  Medication Dose Route Frequency Provider Last Rate Last Dose  . acetaminophen (TYLENOL) tablet 650 mg  650 mg Oral Q6H PRN Jackelyn Poling, NP      . alum & mag hydroxide-simeth (MAALOX/MYLANTA) 200-200-20 MG/5ML suspension 30 mL  30 mL Oral Q4H PRN Nira Conn A, NP      . hydrOXYzine (ATARAX/VISTARIL) tablet 25 mg  25 mg Oral TID PRN Jackelyn Poling, NP      . LORazepam (ATIVAN) tablet 1 mg  1 mg Oral Q6H PRN Nira Conn A, NP      . magnesium hydroxide (MILK OF MAGNESIA) suspension 30 mL  30 mL Oral Daily PRN Nira Conn A, NP      . nicotine (NICODERM CQ - dosed in mg/24 hours) patch 14 mg  14 mg Transdermal Daily Nira Conn A, NP   14 mg at 03/27/19 0017  . traZODone (DESYREL) tablet 50 mg  50 mg Oral QHS Nira Conn A, NP   50 mg at 03/27/19 0017   PTA Medications: Medications Prior to Admission  Medication Sig Dispense Refill Last Dose  . acetaminophen (PAIN & FEVER CHILDRENS) 80 MG chewable tablet Chew 80-160 mg by mouth once as needed for mild pain.   Unknown at Unknown time  . albuterol (PROVENTIL HFA;VENTOLIN HFA) 108 (90 Base) MCG/ACT inhaler Inhale 1-2 puffs into the lungs every 6 (six) hours as needed for wheezing or shortness of breath. 1 Inhaler 0 Unknown at Unknown time  . clindamycin (CLEOCIN) 150 MG capsule Take 1 capsule (150 mg total) by mouth every 6 (six) hours. 28 capsule 0 Unknown at Unknown time  . ibuprofen (ADVIL,MOTRIN) 600 MG tablet Take 1 tablet (600 mg total) by mouth 4 (four) times daily. 30 tablet 0 Unknown at Unknown time  . magic mouthwash SOLN Take 5 mLs by mouth 3 (three) times daily as needed for mouth pain. 100 mL 0 Unknown at Unknown time  . Prenatal Vit-Fe Fumarate-FA (PRENATAL MULTIVITAMIN) TABS tablet Take 1 tablet by mouth daily.    Unknown at Unknown time  . promethazine (PHENERGAN) 12.5 MG tablet Take 1 tablet (12.5 mg total) by mouth  every 6 (six) hours as needed for nausea. 10 tablet 0 Unknown at Unknown time    Musculoskeletal: Strength & Muscle Tone: within normal limits Gait & Station: normal Patient leans: N/A  Psychiatric Specialty Exam: Physical Exam  Nursing note and vitals reviewed. Constitutional: She is oriented to person, place, and time. She appears well-developed and well-nourished. No distress.  Neck: Normal range of motion.  Respiratory: Effort normal.  Musculoskeletal: Normal range of motion.  Neurological: She is alert and oriented to person, place, and time.  Skin: Skin is warm  and dry.  Psychiatric: Her speech is normal and behavior is normal. Judgment and thought content normal. Cognition and memory are normal. Depressed: Stable.    Review of Systems  Psychiatric/Behavioral: Depression: Stable. Hallucinations: Denies. Memory loss: Denies. Substance abuse: Meth and THC. Suicidal ideas: Denies. Nervous/anxious: Stable. Insomnia: Denies.   All other systems reviewed and are negative.   Blood pressure 113/71, pulse 76, temperature 98.4 F (36.9 C), temperature source Oral, resp. rate 20, last menstrual period 03/04/2019, SpO2 100 %, not currently breastfeeding.There is no height or weight on file to calculate BMI.  General Appearance: Casual  Eye Contact:  Good  Speech:  Clear and Coherent and Normal Rate  Volume:  Normal  Mood:  "Good" Appropriate  Affect:  Appropriate and Congruent  Thought Process:  Coherent, Goal Directed and Descriptions of Associations: Intact  Orientation:  Full (Time, Place, and Person)  Thought Content:  WDL  Suicidal Thoughts:  No  Homicidal Thoughts:  No  Memory:  Immediate;   Good Recent;   Good Remote;   Good  Judgement:  Intact  Insight:  Present  Psychomotor Activity:  Normal  Concentration:  Concentration: Good and Attention Span: Good  Recall:  Good  Fund of Knowledge:  Good  Language:  Good  Akathisia:  No  Handed:  Right  AIMS (if indicated):    N/A  Assets:  Communication Skills Desire for Improvement Housing Social Support  ADL's:  Intact  Cognition:  WNL  Sleep:   N/A     Demographic Factors:  Caucasian and Unemployed  Loss Factors: NA  Historical Factors: NA  Risk Reduction Factors:   Responsible for children under 26 years of age, Sense of responsibility to family, Religious beliefs about death, Living with another person, especially a relative and Positive social support  Continued Clinical Symptoms:  Alcohol/Substance Abuse/Dependencies  Cognitive Features That Contribute To Risk:  None    Suicide Risk:  Minimal: No identifiable suicidal ideation.  Patients presenting with no risk factors but with morbid ruminations; may be classified as minimal risk based on the severity of the depressive symptoms    Plan Of Care/Follow-up recommendations:  Activity:  As tolerated Diet:  Heart healthy Other:  Follow up with resources given    Disposition: No evidence of imminent risk to self or others at present.   Patient does not meet criteria for psychiatric inpatient admission. Supportive therapy provided about ongoing stressors. Discussed crisis plan, support from social network, calling 911, coming to the Emergency Department, and calling Suicide Hotline.   Shuvon Rankin, NP 03/27/2019, 11:22 AM   Patient seen face-to-face for psychiatric evaluation, chart reviewed and case discussed with the physician extender and developed treatment plan. Reviewed the information documented and agree with the treatment plan.  Buford Dresser, DO 03/27/19 6:03 PM

## 2019-03-27 NOTE — Patient Outreach (Signed)
CPSS met with the patient in the [facility] observation unit in order to provide substance use recovery support and help with getting connected to substance use treatment resources. Patient reports a history of methamphetamine and marijuana use. Patient is interested in dual diagnosis treatment resources. CPSS talked to the patient about and provided information for outpatient dual diagnosis resources along with information for other substance use recovery resources. Some of these resources include a residential substance use treatment center list, NA meeting list for [place], and CPSS contact information. CPSS strongly encouraged the patient to follow up with CPSS if needed for further help with getting connected to or to ask any questions regarding dual diagnosis treatment resources.

## 2019-03-27 NOTE — BH Assessment (Addendum)
[facility] Assessment Progress Note  Per Buford Dresser, DO, this pt does not require psychiatric hospitalization at this time.  Pt is to be discharged from the [facility] Observation Unit with recommendation to follow up with an outpatient therapist.  Discharge instructions include referral information for [place] [facility], pt's home community, as well as several providers on the [place] panel that provide DBT.  Pt would also benefit from seeing Peer Support Specialists; they will be asked to speak to pt.  Pt's nurse, Jan, has been notified.  Jalene Mullet, [place] Triage Specialist [phone]

## 2019-03-27 NOTE — Plan of Care (Signed)
[facility] Observation Crisis Plan  Reason for Crisis Plan:  Crisis Stabilization   Plan of Care:  Referral for Inpatient Hospitalization  Family Support:    yes  Current Living Environment:  Living Arrangements: Parent  Insurance:   Hospital Account    Name Acct ID Class Status Primary Coverage   [place], [place] [id number] [place] - MEDICAID OF [place]        Guarantor Account (for Hospital Account [id number])    Name Relation to Pt Service Area Active? [place], [place]   Address Phone       [address] [place], [place] [address] [phone])          Coverage Information (for Hospital Account [id number])    F/O Payor/Plan Precert #   MEDICAID [place]/MEDICAID OF [place]    Subscriber Subscriber #   [place], [place] [id number]   Address Phone   [address] [address] [place], [place] [address] [phone]      Legal Guardian:  Legal Guardian: Other:(Self)  Primary Care Provider:  Patient, No Pcp Per  Current Outpatient Providers:  Hampton Abbot, MD  Psychiatrist:  Name of Psychiatrist: None  Counselor/Therapist:  Name of Therapist: None  Compliant with Medications:  no  Additional Information:   Jesusita Oka 8/25/20201:44 AM
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
## 2019-03-27 NOTE — Progress Notes (Signed)
D:Pt is currently in her room eating breakfast. Pt reports that she uses meth 2-3 times a week and would like help with out patient services. Pt lives with her parents and has a two year old child that is currently with the father. Pt recently broke up with her child's father. A:Offered support, encouragement and 15 minute checks.  R:Pt denies si thoughts at this time and says that she has had some recently. Pt denies hi and hallucinations. Safety maintained on the unit.

## 2019-03-27 NOTE — Discharge Instructions (Addendum)
For your behavioral health needs, you are advised to follow up with an outpatient therapist.  Contact one of the following providers at your earliest opportunity to inquire about scheduling an intake appointment:       [facility]      [place]      [place] [address]      [phone]       [facility]      [place], [place] [address]      [phone]       [facility]      [address]      [place], [place] [address]      [phone]       [facility] [place]       [place],  [address]      [phone]

## 2019-03-27 NOTE — Progress Notes (Signed)
Patient ID: Jacqueline Velasquez, female   DOB: 12/16/1995, 23 y.o.   MRN: [id number] Pt A&O x 3, presents with depression and suicidal thoughts in the back of her head she reports.  Pt with history of cutting reports she cut 3 weeks ago.  Cuts to Left arm, well healed.  Pt reports feeling hopeless after break up from boyfriend, father of her daughter.  Denies HI or AVH.  Feeling hopeless.  Monitoring for safety.

## 2019-03-27 NOTE — H&P (Signed)
Behavioral Health Medical Screening Exam  Jacqueline Velasquez is an 23 y.o. female.  Psychiatric Specialty Exam: Physical Exam  Constitutional: She is oriented to person, place, and time. She appears well-developed and well-nourished. No distress.  HENT:  Head: Normocephalic and atraumatic.  Right Ear: External ear normal.  Left Ear: External ear normal.  Eyes: Pupils are equal, round, and reactive to light.  Respiratory: Effort normal. No respiratory distress.  Musculoskeletal: Normal range of motion.  Neurological: She is alert and oriented to person, place, and time.  Skin: She is not diaphoretic.  Psychiatric: Her mood appears anxious. She is not withdrawn and not actively hallucinating. Thought content is not paranoid and not delusional. She expresses impulsivity and inappropriate judgment. She exhibits a depressed mood. She expresses suicidal ideation. She expresses no homicidal ideation. She expresses suicidal plans.    Review of Systems  Constitutional: Negative for chills, diaphoresis, fever, malaise/fatigue and weight loss.  Respiratory: Negative for cough and shortness of breath.   Cardiovascular: Negative for chest pain.  Gastrointestinal: Negative for diarrhea, nausea and vomiting.  Psychiatric/Behavioral: Positive for depression, substance abuse and suicidal ideas. Negative for hallucinations and memory loss. The patient is nervous/anxious and has insomnia.   All other systems reviewed and are negative.   unknown if currently breastfeeding.There is no height or weight on file to calculate BMI.  General Appearance: Casual and Well Groomed  Eye Contact:  Fair  Speech:  Clear and Coherent and Normal Rate  Volume:  Normal  Mood:  Anxious, Depressed, Hopeless and Worthless  Affect:  Congruent and Depressed  Thought Process:  Coherent, Goal Directed and Descriptions of Associations: Intact  Orientation:  Full (Time, Place, and Person)  Thought Content:  Logical  Suicidal  Thoughts:  Yes.  with intent/plan  Homicidal Thoughts:  No  Memory:  Immediate;   Good Recent;   Good  Judgement:  Impaired  Insight:  Fair  Psychomotor Activity:  Increased  Concentration:  Concentration: Fair and Attention Span: Fair  Recall:  Good  Fund of Knowledge:  Good  Language:  Good  Akathisia:  Negative  Handed:  Right  AIMS (if indicated):     Assets:  Communication Skills Desire for Improvement Financial Resources/Insurance Leisure Time Physical Health Resilience  ADL's:  Intact  Cognition:  WNL  Sleep:        Blood pressure 130/82, pulse 84, temperature 98.4 F (36.9 C), temperature source Oral, resp. rate 16, last menstrual period 03/04/2019, SpO2 100 %, not currently breastfeeding.  Recommendations:  Based on my evaluation the patient does not appear to have an emergency medical condition.  Rozetta Nunnery, NP 03/27/2019, 5:16 AM

## 2019-03-27 NOTE — Progress Notes (Signed)
Pt d/c from the hospital. All items returned. D/C instructions given and pt seen by peer support.

## 2019-04-04 ENCOUNTER — Emergency Department (HOSPITAL_COMMUNITY)
Admission: EM | Admit: 2019-04-04 | Discharge: 2019-04-04 | Disposition: A | Discharge status: Home / Self Care | Payer: Medicaid Other | Attending: Emergency Medicine | Admitting: Emergency Medicine

## 2019-04-04 ENCOUNTER — Encounter (HOSPITAL_COMMUNITY): Payer: Self-pay | Admitting: Emergency Medicine

## 2019-04-04 ENCOUNTER — Other Ambulatory Visit: Payer: Self-pay

## 2019-04-04 DIAGNOSIS — K047 Periapical abscess without sinus: Secondary | ICD-10-CM | POA: Insufficient documentation

## 2019-04-04 DIAGNOSIS — K0889 Other specified disorders of teeth and supporting structures: Secondary | ICD-10-CM | POA: Diagnosis present

## 2019-04-04 MED ORDER — PENICILLIN V POTASSIUM 250 MG PO TABS
500.0000 mg | ORAL_TABLET | Freq: Once | ORAL | Status: AC
  Administered 2019-04-04: 500 mg via ORAL
  Filled 2019-04-04: qty 2

## 2019-04-04 MED ORDER — NAPROXEN 250 MG PO TABS
500.0000 mg | ORAL_TABLET | Freq: Once | ORAL | Status: AC
  Administered 2019-04-04: 06:00:00 500 mg via ORAL
  Filled 2019-04-04: qty 2

## 2019-04-04 MED ORDER — NAPROXEN 500 MG PO TABS: 500.0000 mg | ORAL_TABLET | Freq: Two times a day (BID) | ORAL | 0 refills | Status: DC

## 2019-04-04 MED ORDER — PENICILLIN V POTASSIUM 500 MG PO TABS: 500.0000 mg | ORAL_TABLET | Freq: Four times a day (QID) | ORAL | 0 refills | Status: AC

## 2019-04-04 NOTE — ED Triage Notes (Signed)
Pt c/o left lower abscess tooth with left jaw swelling, pain radiates to left ear and down neck since yesterday

## 2019-04-04 NOTE — ED Provider Notes (Signed)
[facility] EMERGENCY DEPARTMENT Provider Note   CSN: [id number] Arrival date & time: 04/04/19  0258     History   Chief Complaint Chief Complaint  Patient presents with  . Dental Pain    HPI Jacqueline Velasquez is a 23 y.o. female.     Patient states she has chronically bad teeth from "calcium deficiency" and is scheduled to see dentist next week for teeth removal.  She is having pain in her left lower jaw for the past 2 days at the site of multiple fractured teeth.  There is some swelling of her jaw as well.  No bleeding or drainage.  Has been using Tylenol at home without relief.  She comes in today because she is concerned that she needs an antibiotic.  No difficulty breathing or difficulty swallowing.  No chest pain or shortness of breath.  States she is going to see a dentist next week in [place] to have all of her teeth pulled.  The history is provided by the patient.  Dental Pain Associated symptoms: no fever and no headaches     Past Medical History:  Diagnosis Date  . Anxiety   . Asthma   . Bipolar 1 disorder (HCC)   . Borderline personality disorder (HCC)   . No pertinent past medical history     Patient Active Problem List   Diagnosis Date Noted  . Major depressive disorder, recurrent episode, severe (HCC) 03/27/2019  . Severe recurrent major depression without psychotic features (HCC) 03/27/2019  . Mood disorder (HCC) 01/24/2012  . Borderline personality disorder (HCC) 01/24/2012    Past Surgical History:  Procedure Laterality Date  . NO PAST SURGERIES       OB History    Gravida  1   Para      Term      Preterm      AB      Living  1     SAB      TAB      Ectopic      Multiple      Live Births               Home Medications    Prior to Admission medications   Not on File    Family History Family History  Problem Relation Age of Onset  . Asthma Mother     Social History Social History   Tobacco Use  . Smoking  status: Current Some Day Smoker    Packs/day: 0.25    Years: 1.00    Pack years: 0.25    Types: Cigarettes  . Smokeless tobacco: Never Used  Substance Use Topics  . Alcohol use: No  . Drug use: Yes    Types: Marijuana    Comment: heroin     Allergies   Patient has no known allergies.   Review of Systems Review of Systems  Constitutional: Negative for activity change, appetite change and fever.  HENT: Positive for dental problem. Negative for rhinorrhea, sore throat and trouble swallowing.   Respiratory: Negative for cough, chest tightness and shortness of breath.   Cardiovascular: Negative for chest pain.  Gastrointestinal: Negative for abdominal pain, nausea and vomiting.  Genitourinary: Negative for dysuria and hematuria.  Musculoskeletal: Negative for arthralgias and myalgias.  Neurological: Negative for dizziness, weakness and headaches.    all other systems are negative except as noted in the HPI and PMH.    Physical Exam Updated Vital Signs BP 111/86 (BP Location: Left  Arm)   Pulse 95   Temp 98.2 F (36.8 C) (Oral)   Resp 16   Ht 5\' 6"  (1.676 m)   Wt 61.2 kg   LMP 03/07/2019   SpO2 100%   BMI 21.79 kg/m   Physical Exam Vitals signs and nursing note reviewed.  Constitutional:      General: She is not in acute distress.    Appearance: She is well-developed.  HENT:     Head: Normocephalic and atraumatic.     Mouth/Throat:     Pharynx: No oropharyngeal exudate.     Comments: Mild swelling to left jaw.  Teeth are in poor condition throughout with multiple teeth fractured at the gumline and diffuse caries. Patient's pain is at the site of her second molar which is mostly missing but roots are intact.  There is surrounding induration but no discrete fluctuance.  Floor mouth is soft.  No trismus or malocclusion Eyes:     Conjunctiva/sclera: Conjunctivae normal.     Pupils: Pupils are equal, round, and reactive to light.  Neck:     Musculoskeletal: Normal  range of motion and neck supple.     Comments: No meningismus. Cardiovascular:     Rate and Rhythm: Normal rate and regular rhythm.     Heart sounds: Normal heart sounds. No murmur.  Pulmonary:     Effort: Pulmonary effort is normal. No respiratory distress.     Breath sounds: Normal breath sounds.  Abdominal:     Palpations: Abdomen is soft.     Tenderness: There is no abdominal tenderness. There is no guarding or rebound.  Musculoskeletal: Normal range of motion.        General: No tenderness.  Lymphadenopathy:     Cervical: Cervical adenopathy present.  Skin:    General: Skin is warm.  Neurological:     Mental Status: She is alert and oriented to person, place, and time.     Cranial Nerves: No cranial nerve deficit.     Motor: No abnormal muscle tone.     Coordination: Coordination normal.     Comments:  5/5 strength throughout. CN 2-12 intact.Equal grip strength.   Psychiatric:        Behavior: Behavior normal.      ED Treatments / Results  Labs (all labs ordered are listed, but only abnormal results are displayed) Labs Reviewed - No data to display  EKG None  Radiology No results found.  Procedures Procedures (including critical care time)  Medications Ordered in ED Medications  penicillin v potassium (VEETID) tablet 500 mg (has no administration in time range)  naproxen (NAPROSYN) tablet 500 mg (has no administration in time range)     Initial Impression / Assessment and Plan / ED Course  I have reviewed the triage vital signs and the nursing notes.  Pertinent labs & imaging results that were available during my care of the patient were reviewed by me and considered in my medical decision making (see chart for details).       Dental pain with probable early abscess.  No drainable abscess today.  No evidence of Ludwig's angina.  We will treat with antibiotics and nonnarcotic pain medication given patient's history of opiate abuse in the past.   Advised follow-up with dentistry soon as possible. Use warm compresses. Return precautions discussed including difficulty breathing difficulty swallowing, fever or other concerns.  Final Clinical Impressions(s) / ED Diagnoses   Final diagnoses:  Dental abscess    ED Discharge Orders  None       Glynn Octaveancour, Anees Vanecek, MD 04/04/19 [phone]

## 2019-04-04 NOTE — Discharge Instructions (Signed)
There is no drainable abscess today. Take the antibiotics as prescribed and use the warm compresses as we discussed.  Follow-up with your doctor soon as possible for tooth extraction.  Return to the ED with difficulty breathing difficulty swallowing or other concerns.

## 2019-09-23 ENCOUNTER — Encounter (HOSPITAL_COMMUNITY): Payer: Self-pay | Admitting: Emergency Medicine

## 2019-09-23 ENCOUNTER — Emergency Department (HOSPITAL_COMMUNITY)
Admission: EM | Admit: 2019-09-23 | Discharge: 2019-09-23 | Disposition: A | Discharge status: Home / Self Care | Payer: Medicaid Other | Attending: Emergency Medicine | Admitting: Emergency Medicine

## 2019-09-23 ENCOUNTER — Other Ambulatory Visit: Payer: Self-pay

## 2019-09-23 DIAGNOSIS — Z87891 Personal history of nicotine dependence: Secondary | ICD-10-CM | POA: Diagnosis not present

## 2019-09-23 DIAGNOSIS — J020 Streptococcal pharyngitis: Secondary | ICD-10-CM | POA: Diagnosis not present

## 2019-09-23 DIAGNOSIS — J45909 Unspecified asthma, uncomplicated: Secondary | ICD-10-CM | POA: Diagnosis not present

## 2019-09-23 DIAGNOSIS — J029 Acute pharyngitis, unspecified: Secondary | ICD-10-CM | POA: Diagnosis present

## 2019-09-23 DIAGNOSIS — Z79899 Other long term (current) drug therapy: Secondary | ICD-10-CM | POA: Diagnosis not present

## 2019-09-23 LAB — GROUP A STREP BY PCR: Group A Strep by PCR: DETECTED — AB

## 2019-09-23 MED ORDER — AMOXICILLIN 500 MG PO CAPS: 500.0000 mg | ORAL_CAPSULE | Freq: Three times a day (TID) | ORAL | 0 refills | Status: DC

## 2019-09-23 NOTE — ED Notes (Signed)
Pt reports 2 day hx of sorer throat   Has had strep in hte past and feels like this is similar  Tonsils are swollen and reddened   Pt is clear voice reports that when she swallows feels like broken glass

## 2019-09-23 NOTE — ED Notes (Signed)
Strep swab to lab 

## 2019-09-23 NOTE — ED Provider Notes (Signed)
[facility] EMERGENCY DEPARTMENT Provider Note   CSN: [id number] Arrival date & time: 09/23/19  1227     History Chief Complaint  Patient presents with  . Sore Throat    Jacqueline Velasquez is a 24 y.o. female.  The history is provided by the patient. No language interpreter was used.  Sore Throat This is a new problem. The problem occurs constantly. The problem has been gradually worsening. Pertinent negatives include no headaches. Nothing aggravates the symptoms. Nothing relieves the symptoms. She has tried nothing for the symptoms. The treatment provided no relief.   Pt complains of a sorethroat     Past Medical History:  Diagnosis Date  . Anxiety   . Asthma   . Bipolar 1 disorder (HCC)   . Borderline personality disorder (HCC)   . No pertinent past medical history     Patient Active Problem List   Diagnosis Date Noted  . Major depressive disorder, recurrent episode, severe (HCC) 03/27/2019  . Severe recurrent major depression without psychotic features (HCC) 03/27/2019  . Mood disorder (HCC) 01/24/2012  . Borderline personality disorder (HCC) 01/24/2012    Past Surgical History:  Procedure Laterality Date  . NO PAST SURGERIES       OB History    Gravida  1   Para      Term      Preterm      AB      Living  1     SAB      TAB      Ectopic      Multiple      Live Births              Family History  Problem Relation Age of Onset  . Asthma Mother     Social History   Tobacco Use  . Smoking status: Former Smoker    Packs/day: 0.25    Years: 1.00    Pack years: 0.25    Types: Cigarettes  . Smokeless tobacco: Never Used  Substance Use Topics  . Alcohol use: No  . 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 Authorizing Provider  amoxicillin (AMOXIL) 500 MG capsule Take 1 capsule (500 mg total) by mouth 3 (three)  times daily. 09/23/19   Elson Areas, PA-C  naproxen (NAPROSYN) 500 MG tablet Take 1 tablet (500 mg total) by mouth 2 (two) times daily with a meal. 04/04/19   Rancour, Jeannett Senior, MD    Allergies    Clindamycin/lincomycin  Review of Systems   Review of Systems  Neurological: Negative for headaches.  All other systems reviewed and are negative.   Physical Exam Updated Vital Signs BP 124/76 (BP Location: Right Arm)   Pulse (!) 101   Temp 99.1 F (37.3 C) (Oral)   Resp 14   Ht 5\' 7"  (1.702 m)   Wt 78 kg   LMP 09/16/2019   SpO2 100%   BMI 26.94 kg/m   Physical Exam Vitals reviewed.  Constitutional:      Appearance: She is well-developed.  HENT:     Head: Normocephalic.     Mouth/Throat:     Mouth: Mucous membranes are moist.  Eyes:     Conjunctiva/sclera: Conjunctivae normal.  Cardiovascular:     Rate and Rhythm: Normal rate.  Pulmonary:     Effort: Pulmonary effort  is normal.  Abdominal:     Palpations: Abdomen is soft.  Musculoskeletal:     Cervical back: Normal range of motion.  Skin:    General: Skin is warm.  Neurological:     General: No focal deficit present.     Mental Status: She is alert.  Psychiatric:        Mood and Affect: Mood normal.     ED Results / Procedures / Treatments   Labs (all labs ordered are listed, but only abnormal results are displayed) Labs Reviewed  GROUP A STREP BY PCR - Abnormal; Notable for the following components:      Result Value   Group A Strep by PCR DETECTED (*)    All other components within normal limits    EKG None  Radiology No results found.  Procedures Procedures (including critical care time)  Medications Ordered in ED Medications - No data to display  ED Course  I have reviewed the triage vital signs and the nursing notes.  Pertinent labs & imaging results that were available during my care of the patient were reviewed by me and considered in my medical decision making (see chart for  details).    MDM Rules/Calculators/A&P                      Final Clinical Impression(s) / ED Diagnoses Final diagnoses:  Strep throat    Rx / DC Orders ED Discharge Orders         Ordered    amoxicillin (AMOXIL) 500 MG capsule  3 times daily     09/23/19 1327        An After Visit Summary was printed and given to the patient.    Fransico Meadow, [place] 09/23/19 1449    Milton Ferguson, MD 09/24/19 [phone]

## 2019-09-23 NOTE — Discharge Instructions (Addendum)
Return if any problems.

## 2019-09-23 NOTE — ED Triage Notes (Signed)
Pt states that she has had a sore throat for 2 days. Denies cough states that she has been warm but has not been able to check her temp

## 2020-02-04 DIAGNOSIS — R0602 Shortness of breath: Secondary | ICD-10-CM | POA: Diagnosis not present

## 2020-02-04 DIAGNOSIS — R069 Unspecified abnormalities of breathing: Secondary | ICD-10-CM | POA: Diagnosis not present

## 2020-03-01 DIAGNOSIS — R069 Unspecified abnormalities of breathing: Secondary | ICD-10-CM | POA: Diagnosis not present

## 2020-03-01 DIAGNOSIS — R0602 Shortness of breath: Secondary | ICD-10-CM | POA: Diagnosis not present

## 2020-03-25 ENCOUNTER — Other Ambulatory Visit: Payer: Self-pay

## 2020-03-25 ENCOUNTER — Emergency Department (HOSPITAL_COMMUNITY)
Admission: EM | Admit: 2020-03-25 | Discharge: 2020-03-25 | Disposition: A | Discharge status: Home / Self Care | Payer: Medicaid Other | Attending: Emergency Medicine | Admitting: Emergency Medicine

## 2020-03-25 ENCOUNTER — Encounter (HOSPITAL_COMMUNITY): Payer: Self-pay | Admitting: Emergency Medicine

## 2020-03-25 DIAGNOSIS — J45909 Unspecified asthma, uncomplicated: Secondary | ICD-10-CM | POA: Diagnosis not present

## 2020-03-25 DIAGNOSIS — R0602 Shortness of breath: Secondary | ICD-10-CM | POA: Diagnosis not present

## 2020-03-25 DIAGNOSIS — R0902 Hypoxemia: Secondary | ICD-10-CM | POA: Diagnosis not present

## 2020-03-25 DIAGNOSIS — Z5321 Procedure and treatment not carried out due to patient leaving prior to being seen by health care provider: Secondary | ICD-10-CM | POA: Diagnosis not present

## 2020-03-25 DIAGNOSIS — R000 Tachycardia, unspecified: Secondary | ICD-10-CM | POA: Diagnosis not present

## 2020-03-25 DIAGNOSIS — R069 Unspecified abnormalities of breathing: Secondary | ICD-10-CM | POA: Diagnosis not present

## 2020-03-25 NOTE — ED Triage Notes (Signed)
[facility] - pt states she had an asthma attack around 0000. Only called EMS because she was out of her inhaler and needed a neb. Pt states she feels better after neb in EMS.

## 2020-04-19 DIAGNOSIS — I959 Hypotension, unspecified: Secondary | ICD-10-CM | POA: Diagnosis not present

## 2020-04-19 DIAGNOSIS — R0689 Other abnormalities of breathing: Secondary | ICD-10-CM | POA: Diagnosis not present

## 2020-04-19 DIAGNOSIS — R0902 Hypoxemia: Secondary | ICD-10-CM | POA: Diagnosis not present

## 2020-04-19 DIAGNOSIS — J80 Acute respiratory distress syndrome: Secondary | ICD-10-CM | POA: Diagnosis not present

## 2020-04-21 DIAGNOSIS — R0902 Hypoxemia: Secondary | ICD-10-CM | POA: Diagnosis not present

## 2021-04-01 ENCOUNTER — Ambulatory Visit: Payer: Self-pay | Admitting: Nurse Practitioner

## 2021-04-05 DIAGNOSIS — R0689 Other abnormalities of breathing: Secondary | ICD-10-CM | POA: Diagnosis not present

## 2021-04-05 DIAGNOSIS — R000 Tachycardia, unspecified: Secondary | ICD-10-CM | POA: Diagnosis not present

## 2021-07-02 DIAGNOSIS — Z419 Encounter for procedure for purposes other than remedying health state, unspecified: Secondary | ICD-10-CM | POA: Diagnosis not present

## 2021-11-30 ENCOUNTER — Ambulatory Visit: Payer: Self-pay

## 2021-11-30 ENCOUNTER — Encounter: Payer: Self-pay | Admitting: Emergency Medicine

## 2021-11-30 ENCOUNTER — Ambulatory Visit
Admission: EM | Admit: 2021-11-30 | Discharge: 2021-11-30 | Disposition: A | Discharge status: Home / Self Care | Payer: Medicaid Other | Attending: Family Medicine | Admitting: Family Medicine

## 2021-11-30 ENCOUNTER — Ambulatory Visit: Admit: 2021-11-30 | Discharge: 2021-11-30 | Discharge status: Absent without leave | Payer: Self-pay

## 2021-11-30 ENCOUNTER — Other Ambulatory Visit: Payer: Self-pay

## 2021-11-30 DIAGNOSIS — K047 Periapical abscess without sinus: Secondary | ICD-10-CM

## 2021-11-30 MED ORDER — CHLORHEXIDINE GLUCONATE 0.12 % MT SOLN: 15.0000 mL | Freq: Two times a day (BID) | OROMUCOSAL | 0 refills | Status: DC

## 2021-11-30 MED ORDER — AMOXICILLIN-POT CLAVULANATE 875-125 MG PO TABS: 1.0000 | ORAL_TABLET | Freq: Two times a day (BID) | ORAL | 0 refills | Status: DC

## 2021-11-30 MED ORDER — LIDOCAINE VISCOUS HCL 2 % MT SOLN: 10.0000 mL | OROMUCOSAL | 0 refills | Status: AC | PRN

## 2021-11-30 NOTE — ED Triage Notes (Signed)
Pt reports left sided dental pain x1 week with increase in pain x2 days. Pt reports has taken excedrin for pain and denies any improvement in pain.  ? ?Pt is not currently being treated by a dentist.  ?

## 2021-11-30 NOTE — ED Provider Notes (Signed)
?[facility] ? ? ? ?CSN: [id number] ?Arrival date & time: 11/30/21  1330 ? ? ?  ? ?History   ?Chief Complaint ?Chief Complaint  ?Patient presents with  ? Dental Pain  ? ? ?HPI ?Jacqueline Velasquez is a 26 y.o. female.  ? ?Presenting today with about a week of left upper dental pain that has significantly worsened the past 2 days now with facial swelling as well.  Denies fever, chills, dysphagia, headache. Trying over-the-counter pain relievers with no relief. ? ? ?Past Medical History:  ?Diagnosis Date  ? Anxiety   ? Asthma   ? Bipolar 1 disorder (HCC)   ? Borderline personality disorder (HCC)   ? No pertinent past medical history   ? ? ?Patient Active Problem List  ? Diagnosis Date Noted  ? Major depressive disorder, recurrent episode, severe (HCC) 03/27/2019  ? Severe recurrent major depression without psychotic features (HCC) 03/27/2019  ? Mood disorder (HCC) 01/24/2012  ? Borderline personality disorder (HCC) 01/24/2012  ? ? ?Past Surgical History:  ?Procedure Laterality Date  ? NO PAST SURGERIES    ? ? ?OB History   ? ? Gravida  ?1  ? Para  ?   ? Term  ?   ? Preterm  ?   ? AB  ?   ? Living  ?1  ?  ? ? SAB  ?   ? IAB  ?   ? Ectopic  ?   ? Multiple  ?   ? Live Births  ?   ?   ?  ?  ? ? ? ?Home Medications   ? ?Prior to Admission medications   ?Medication Sig Start Date End Date Taking? Authorizing Provider  ?amoxicillin-clavulanate (AUGMENTIN) 875-125 MG tablet Take 1 tablet by mouth every 12 (twelve) hours. 11/30/21  Yes Particia Nearing, PA-C  ?chlorhexidine (PERIDEX) 0.12 % solution Use as directed 15 mLs in the mouth or throat 2 (two) times daily. 11/30/21  Yes Particia Nearing, PA-C  ?lidocaine (XYLOCAINE) 2 % solution Use as directed 10 mLs in the mouth or throat every 3 (three) hours as needed for mouth pain. 11/30/21  Yes Particia Nearing, PA-C  ?amoxicillin (AMOXIL) 500 MG capsule Take 1 capsule (500 mg total) by mouth 3 (three) times daily. 09/23/19   Elson Areas, PA-C  ?naproxen  (NAPROSYN) 500 MG tablet Take 1 tablet (500 mg total) by mouth 2 (two) times daily with a meal. 04/04/19   Glynn Octave, MD  ? ? ?Family History ?Family History  ?Problem Relation Age of Onset  ? Asthma Mother   ? ? ?Social History ?Social History  ? ?Tobacco Use  ? Smoking status: Former  ?  Packs/day: 0.25  ?  Years: 1.00  ?  Pack years: 0.25  ?  Types: Cigarettes  ? Smokeless tobacco: Never  ?Vaping Use  ? Vaping Use: Every day  ?Substance Use Topics  ? Alcohol use: No  ?  Comment: occ  ? Drug use: Yes  ?  Types: Marijuana  ?  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 ? ? ?Allergies   ?Clindamycin/lincomycin ? ? ?Review of Systems ?Review of Systems ?Per HPI ? ?Physical Exam ?Triage Vital Signs ?ED Triage Vitals [11/30/21 1422]  ?Enc Vitals Group  ?   BP 133/86  ?   Pulse Rate 75  ?   Resp 18  ?   Temp 98.6 ?F (37 ?C)  ?   Temp Source Oral  ?  SpO2 96 %  ?   Weight 155 lb (70.3 kg)  ?   Height 5\' 7"  (1.702 m)  ?   Head Circumference   ?   Peak Flow   ?   Pain Score 6  ?   Pain Loc   ?   Pain Edu?   ?   Excl. in GC?   ? ?No data found. ? ?Updated Vital Signs ?BP 133/86 (BP Location: Right Arm)   Pulse 75   Temp 98.6 ?F (37 ?C) (Oral)   Resp 18   Ht 5\' 7"  (1.702 m)   Wt 155 lb (70.3 kg)   LMP 11/15/2021 (Approximate)   SpO2 96%   BMI 24.28 kg/m?  ? ?Visual Acuity ?Right Eye Distance:   ?Left Eye Distance:   ?Bilateral Distance:   ? ?Right Eye Near:   ?Left Eye Near:    ?Bilateral Near:    ? ?Physical Exam ?Vitals and nursing note reviewed.  ?Constitutional:   ?   Appearance: Normal appearance. She is not ill-appearing.  ?HENT:  ?   Head: Atraumatic.  ?   Nose: Nose normal.  ?   Mouth/Throat:  ?   Comments: Gingival erythema, edema left upper anterior region ?Eyes:  ?   Extraocular Movements: Extraocular movements intact.  ?   Conjunctiva/sclera: Conjunctivae normal.  ?Cardiovascular:  ?   Rate and Rhythm: Normal rate and regular rhythm.  ?   Heart sounds: Normal heart sounds.  ?Pulmonary:  ?   Effort: Pulmonary effort  is normal.  ?   Breath sounds: Normal breath sounds.  ?Musculoskeletal:     ?   General: Normal range of motion.  ?   Cervical back: Normal range of motion and neck supple.  ?Skin: ?   General: Skin is warm and dry.  ?Neurological:  ?   Mental Status: She is alert and oriented to person, place, and time.  ?   Motor: No weakness.  ?   Gait: Gait normal.  ?Psychiatric:     ?   Mood and Affect: Mood normal.     ?   Thought Content: Thought content normal.     ?   Judgment: Judgment normal.  ? ? ? ?UC Treatments / Results  ?Labs ?(all labs ordered are listed, but only abnormal results are displayed) ?Labs Reviewed - No data to display ? ?EKG ? ? ?Radiology ?No results found. ? ?Procedures ?Procedures (including critical care time) ? ?Medications Ordered in UC ?Medications - No data to display ? ?Initial Impression / Assessment and Plan / UC Course  ?I have reviewed the triage vital signs and the nursing notes. ? ?Pertinent labs & imaging results that were available during my care of the patient were reviewed by me and considered in my medical decision making (see chart for details). ? ?  ? ?Treat with Augmentin, Peridex, viscous lidocaine, over-the-counter pain relievers.  Return for acutely worsening symptoms. ? ?Final Clinical Impressions(s) / UC Diagnoses  ? ?Final diagnoses:  ?Dental infection  ? ?Discharge Instructions   ?None ?  ? ?ED Prescriptions   ? ? Medication Sig Dispense Auth. Provider  ? amoxicillin-clavulanate (AUGMENTIN) 875-125 MG tablet Take 1 tablet by mouth every 12 (twelve) hours. 14 tablet Particia NearingLane, Adylee Leonardo [place], [place]-C  ? chlorhexidine (PERIDEX) 0.12 % solution Use as directed 15 mLs in the mouth or throat 2 (two) times daily. 120 mL Particia NearingLane, Shalanda Brogden [place], [place]-C  ? lidocaine (XYLOCAINE) 2 % solution Use as directed 10 mLs  in the mouth or throat every 3 (three) hours as needed for mouth pain. 100 mL Particia Nearing, [place]  ? ?  ? ?PDMP not reviewed this encounter. ?  ?Particia Nearing,  PA-C ?11/30/21 1605 ? ?

## 2022-03-21 ENCOUNTER — Encounter: Payer: Self-pay | Admitting: Emergency Medicine

## 2022-03-21 ENCOUNTER — Ambulatory Visit
Admission: EM | Admit: 2022-03-21 | Discharge: 2022-03-21 | Disposition: A | Discharge status: Home / Self Care | Payer: Medicaid Other | Attending: Nurse Practitioner | Admitting: Nurse Practitioner

## 2022-03-21 DIAGNOSIS — U071 COVID-19: Secondary | ICD-10-CM | POA: Insufficient documentation

## 2022-03-21 DIAGNOSIS — K5900 Constipation, unspecified: Secondary | ICD-10-CM | POA: Diagnosis present

## 2022-03-21 DIAGNOSIS — B349 Viral infection, unspecified: Secondary | ICD-10-CM | POA: Insufficient documentation

## 2022-03-21 DIAGNOSIS — Z9189 Other specified personal risk factors, not elsewhere classified: Secondary | ICD-10-CM | POA: Diagnosis present

## 2022-03-21 MED ORDER — SENNA 8.6 MG PO TABS: 1.0000 | ORAL_TABLET | Freq: Every evening | ORAL | 0 refills | Status: AC | PRN

## 2022-03-21 MED ORDER — POLYETHYLENE GLYCOL 3350 17 GM/SCOOP PO POWD: 1.0000 | Freq: Once | ORAL | 0 refills | Status: AC

## 2022-03-21 NOTE — ED Provider Notes (Signed)
[facility]    CSN: [id number] Arrival date & time: 03/21/22  1255      History   Chief Complaint No chief complaint on file.   HPI Jacqueline Velasquez is a 26 y.o. female.   The history is provided by the patient.   Patient presents for complaints of low back pain, generalized body aches, subjective fevers and chills that been present for the past 3 days.  She denies headache, sore throat, congestion, runny nose, cough, abdominal pain, vomiting, or diarrhea.  Patient states that her boyfriend was sick with the same or similar symptoms, but his symptoms improved within 24 hours.  Patient is also concerned that she is constipated.  States her last bowel movement was approximately 4 days ago in which she had to strain.  She states that when she feels like she has have a bowel movement, she begins to feel nauseated.  She has not taken any medication for her symptoms.  Past Medical History:  Diagnosis Date   Anxiety    Asthma    Bipolar 1 disorder (HCC)    Borderline personality disorder (HCC)    No pertinent past medical history     Patient Active Problem List   Diagnosis Date Noted   Major depressive disorder, recurrent episode, severe (HCC) 03/27/2019   Severe recurrent major depression without psychotic features (HCC) 03/27/2019   Mood disorder (HCC) 01/24/2012   Borderline personality disorder (HCC) 01/24/2012    Past Surgical History:  Procedure Laterality Date   NO PAST SURGERIES      OB History     Gravida  1   Para      Term      Preterm      AB      Living  1      SAB      IAB      Ectopic      Multiple      Live Births               Home Medications    Prior to Admission medications   Medication Sig Start Date End Date Taking? Authorizing Provider  polyethylene glycol powder (GLYCOLAX/MIRALAX) 17 GM/SCOOP powder Take 255 g by mouth once for 1 dose. 03/21/22 03/21/22 Yes Sharia Averitt-Warren, Sadie Haber, NP  senna (SENOKOT) 8.6 MG  TABS tablet Take 1 tablet (8.6 mg total) by mouth at bedtime as needed for mild constipation. 03/21/22 04/20/22 Yes Mal Asher-Warren, Sadie Haber, NP  amoxicillin (AMOXIL) 500 MG capsule Take 1 capsule (500 mg total) by mouth 3 (three) times daily. 09/23/19   Elson Areas, PA-C  amoxicillin-clavulanate (AUGMENTIN) 875-125 MG tablet Take 1 tablet by mouth every 12 (twelve) hours. 11/30/21   Particia Nearing, PA-C  chlorhexidine (PERIDEX) 0.12 % solution Use as directed 15 mLs in the mouth or throat 2 (two) times daily. 11/30/21   Particia Nearing, PA-C  lidocaine (XYLOCAINE) 2 % solution Use as directed 10 mLs in the mouth or throat every 3 (three) hours as needed for mouth pain. 11/30/21   Particia Nearing, PA-C  naproxen (NAPROSYN) 500 MG tablet Take 1 tablet (500 mg total) by mouth 2 (two) times daily with a meal. 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 --      Excl. in GC? --    No data found.  Updated Vital Signs BP 115/72 (BP Location: Right Arm)   Pulse (!) 113    Temp 99.7 F (37.6 C) (Oral)   Resp 18   LMP 03/02/2022 (Exact Date)   SpO2 99%   Visual Acuity Right Eye Distance:   Left Eye Distance:   Bilateral Distance:    Right Eye Near:   Left Eye Near:    Bilateral Near:     Physical Exam Vitals and nursing note reviewed.  Constitutional:      General: She is not in acute distress.    Appearance: Normal appearance.  HENT:     Head: Normocephalic.     Right Ear: Tympanic membrane, ear canal and external ear normal.     Left Ear: Tympanic membrane, ear canal and external ear normal.     Nose: Nose normal.     Right Turbinates: Enlarged and swollen.     Left Turbinates: Enlarged and swollen.     Right Sinus: No maxillary sinus tenderness or frontal sinus tenderness.     Left Sinus: No maxillary sinus tenderness or frontal sinus tenderness.     Mouth/Throat:     Lips: Pink.     Mouth: Mucous membranes are moist.     Tongue: No lesions.     Pharynx: Uvula midline. No oropharyngeal exudate, posterior oropharyngeal erythema or uvula swelling.     Tonsils: No tonsillar exudate.  Eyes:     Extraocular Movements: Extraocular movements intact.     Conjunctiva/sclera: Conjunctivae normal.     Pupils: Pupils are equal, round, and reactive to light.  Cardiovascular:     Rate and Rhythm: Normal rate and regular rhythm.     Pulses: Normal pulses.     Heart sounds: Normal heart sounds.  Pulmonary:     Effort: Pulmonary effort is normal.     Breath sounds: Normal breath sounds.  Abdominal:     General: Bowel sounds are normal.     Palpations: Abdomen is soft.     Tenderness: There is no abdominal tenderness.  Musculoskeletal:     Cervical back: Normal range of motion.  Lymphadenopathy:     Cervical: No cervical adenopathy.  Skin:    General: Skin is warm and dry.  Neurological:  General: No focal deficit present.     Mental Status: She is alert and oriented to person, place, and time.  Psychiatric:        Mood and Affect: Mood  normal.        Behavior: Behavior normal.      UC Treatments / Results  Labs (all labs ordered are listed, but only abnormal results are displayed) Labs Reviewed  SARS CORONAVIRUS 2 (TAT 6-24 HRS)    EKG   Radiology No results found.  Procedures Procedures (including critical care time)  Medications Ordered in UC Medications - No data to display  Initial Impression / Assessment and Plan / UC Course  I have reviewed the triage vital signs and the nursing notes.  Pertinent labs & imaging results that were available during my care of the patient were reviewed by me and considered in my medical decision making (see chart for details).  Patient presents with 2-day history of chills, pain around the rib cage, and low back pain.  Patient's vital signs are stable, although she is mildly tachycardic, she is in no acute distress.  Exam is benign.  Patient has no symptoms of acute abdomen, with no injury to her back or rib cage.  COVID test is pending at this time.  Patient is concern for constipation, will prescribe patient MiraLAX and Senokot for her symptoms.  Supportive care recommendations were provided to the patient to include increasing fluids and dietary modifications to help with her constipation.  Patient is in agreement with molnupiravir as an antiviral if her COVID test is positive.  Patient advised she will be contacted if her test is positive.  Patient verbalizes understanding.  Patient advised to follow-up with her PCP if symptoms fail to improve. Final Clinical Impressions(s) / UC Diagnoses   Final diagnoses:  At increased risk of exposure to COVID-19 virus  Constipation, unspecified constipation type  Viral illness     Discharge Instructions      COVID test is pending at this time.  If your results are positive, you will be contacted.  As discussed, you are a candidate to receive molnupiravir as an antiviral. Continue use of Tylenol or Aleve as needed for pain,  fever, or general discomfort. Increase fluids and allow for plenty of rest.  Try to drink at least 8-10 8 ounce glasses of water daily to help with your constipation. Increase your fruits and vegetables in your diet.  Apples are a great source of fiber. Follow-up with your primary care physician if symptoms fail to improve.     ED Prescriptions     Medication Sig Dispense Auth. Provider   polyethylene glycol powder (GLYCOLAX/MIRALAX) 17 GM/SCOOP powder Take 255 g by mouth once for 1 dose. 289 g Misao Fackrell-Warren, Sadie Haber, NP   senna (SENOKOT) 8.6 MG TABS tablet Take 1 tablet (8.6 mg total) by mouth at bedtime as needed for mild constipation. 30 tablet Zyere Jiminez-Warren, Sadie Haber, NP      PDMP not reviewed this encounter.   Abran Cantor, NP 03/21/22 1623

## 2022-03-21 NOTE — Discharge Instructions (Signed)
COVID test is pending at this time.  If your results are positive, you will be contacted.  As discussed, you are a candidate to receive molnupiravir as an antiviral. Continue use of Tylenol or Aleve as needed for pain, fever, or general discomfort. Increase fluids and allow for plenty of rest.  Try to drink at least 8-10 8 ounce glasses of water daily to help with your constipation. Increase your fruits and vegetables in your diet.  Apples are a great source of fiber. Follow-up with your primary care physician if symptoms fail to improve.

## 2022-03-21 NOTE — ED Triage Notes (Signed)
Chills, pain around rib area and lower back aching x 2 days.

## 2022-03-22 ENCOUNTER — Telehealth (HOSPITAL_COMMUNITY): Payer: Self-pay | Admitting: Emergency Medicine

## 2022-03-22 LAB — SARS CORONAVIRUS 2 (TAT 6-24 HRS): SARS Coronavirus 2: POSITIVE — AB

## 2022-03-22 MED ORDER — MOLNUPIRAVIR EUA 200MG CAPSULE: 4.0000 | ORAL_CAPSULE | Freq: Two times a day (BID) | ORAL | 0 refills | Status: AC

## 2022-10-02 ENCOUNTER — Telehealth: Payer: Medicaid Other | Admitting: Nurse Practitioner

## 2022-10-02 NOTE — Progress Notes (Signed)
  Phone number on chart was not working and could not send patient a message or call. Tried email with no response.   No show.

## 2022-10-06 ENCOUNTER — Telehealth: Payer: Medicaid Other | Admitting: Nurse Practitioner

## 2022-10-06 DIAGNOSIS — J302 Other seasonal allergic rhinitis: Secondary | ICD-10-CM

## 2022-10-06 DIAGNOSIS — J4521 Mild intermittent asthma with (acute) exacerbation: Secondary | ICD-10-CM

## 2022-10-06 MED ORDER — PREDNISONE 20 MG PO TABS: 20.0000 mg | ORAL_TABLET | Freq: Two times a day (BID) | ORAL | 0 refills | Status: AC

## 2022-10-06 MED ORDER — IPRATROPIUM BROMIDE 0.03 % NA SOLN: 2.0000 | Freq: Two times a day (BID) | NASAL | 12 refills | Status: AC

## 2022-10-06 MED ORDER — ALBUTEROL SULFATE HFA 108 (90 BASE) MCG/ACT IN AERS: 2.0000 | INHALATION_SPRAY | Freq: Four times a day (QID) | RESPIRATORY_TRACT | 0 refills | Status: DC | PRN

## 2022-10-06 MED ORDER — CETIRIZINE HCL 10 MG PO TABS: 10.0000 mg | ORAL_TABLET | Freq: Every day | ORAL | 2 refills | Status: AC

## 2022-10-06 NOTE — Progress Notes (Signed)
Virtual Visit Consent   Jacqueline Velasquez, you are scheduled for a virtual visit with a [facility] provider today. Just as with appointments in the office, your consent must be obtained to participate. Your consent will be active for this visit and any virtual visit you may have with one of our providers in the next 365 days. If you have a MyChart account, a copy of this consent can be sent to you electronically.  As this is a virtual visit, video technology does not allow for your provider to perform a traditional examination. This may limit your provider's ability to fully assess your condition. If your provider identifies any concerns that need to be evaluated in person or the need to arrange testing (such as labs, EKG, etc.), we will make arrangements to do so. Although advances in technology are sophisticated, we cannot ensure that it will always work on either your end or our end. If the connection with a video visit is poor, the visit may have to be switched to a telephone visit. With either a video or telephone visit, we are not always able to ensure that we have a secure connection.  By engaging in this virtual visit, you consent to the provision of healthcare and authorize for your insurance to be billed (if applicable) for the services provided during this visit. Depending on your insurance coverage, you may receive a charge related to this service.  I need to obtain your verbal consent now. Are you willing to proceed with your visit today? Jacqueline Velasquez has provided verbal consent on 10/06/2022 for a virtual visit (video or telephone). Apolonio Schneiders, FNP  Date: 10/06/2022 3:29 PM  Virtual Visit via Video Note   I, Apolonio Schneiders, connected with  Jacqueline Velasquez  ([id number], 12-03-1995) on 10/06/22 at  3:30 PM EST by a video-enabled telemedicine application and verified that I am speaking with the correct person using two identifiers.  Location: Patient: Virtual Visit Location Patient:  Home Provider: Virtual Visit Location Provider: Home Office   I discussed the limitations of evaluation and management by telemedicine and the availability of in person appointments. The patient expressed understanding and agreed to proceed.    History of Present Illness: Jacqueline Velasquez is a 27 y.o. who identifies as a female who was assigned female at birth, and is being seen today for asthma exacerbation.  She has an appointment with a new PCP in 2 weeks  She is out of her Albuterol inhaler   She recently had a URI that caused her to have a productive cough and more asthma exacerbations   She continues to have a productive cough that has been present for the past three days  Denies fevers  Denies COVID testing   She does suffer from seasonal allergies and is not currently taking anything for allergies    Problems:  Patient Active Problem List   Diagnosis Date Noted   Major depressive disorder, recurrent episode, severe ([place]) 03/27/2019   Severe recurrent major depression without psychotic features ([place]) 03/27/2019   Mood disorder ([place]) 01/24/2012   Borderline personality disorder ([place]) 01/24/2012    Allergies:  Allergies  Allergen Reactions   Clindamycin/Lincomycin    Medications:  Current Outpatient Medications:    amoxicillin (AMOXIL) 500 MG capsule, Take 1 capsule (500 mg total) by mouth 3 (three) times daily., Disp: 30 capsule, Rfl: 0   amoxicillin-clavulanate (AUGMENTIN) 875-125 MG tablet, Take 1 tablet by mouth every 12 (twelve) hours., Disp: 14 tablet,  Rfl: 0   chlorhexidine (PERIDEX) 0.12 % solution, Use as directed 15 mLs in the mouth or throat 2 (two) times daily., Disp: 120 mL, Rfl: 0   lidocaine (XYLOCAINE) 2 % solution, Use as directed 10 mLs in the mouth or throat every 3 (three) hours as needed for mouth pain., Disp: 100 mL, Rfl: 0   naproxen (NAPROSYN) 500 MG tablet, Take 1 tablet (500 mg total) by mouth 2 (two) times daily with a meal., Disp: 30 tablet, Rfl:  0  Observations/Objective: Patient is well-developed, well-nourished in no acute distress.  Resting comfortably  at home.  Head is normocephalic, atraumatic.  No labored breathing.  Speech is clear and coherent with logical content.  Patient is alert and oriented at baseline.    Assessment and Plan: 1. Mild intermittent asthma with acute exacerbation  - albuterol (VENTOLIN HFA) 108 (90 Base) MCG/ACT inhaler; Inhale 2 puffs into the lungs every 6 (six) hours as needed for wheezing or shortness of breath.  Dispense: 8 g; Refill: 0 - predniSONE (DELTASONE) 20 MG tablet; Take 1 tablet (20 mg total) by mouth 2 (two) times daily with a meal for 5 days.  Dispense: 10 tablet; Refill: 0  2. Seasonal allergies  - ipratropium (ATROVENT) 0.03 % nasal spray; Place 2 sprays into both nostrils every 12 (twelve) hours.  Dispense: 30 mL; Refill: 12 - cetirizine (ZYRTEC ALLERGY) 10 MG tablet; Take 1 tablet (10 mg total) by mouth daily.  Dispense: 30 tablet; Refill: 2     Follow Up Instructions: I discussed the assessment and treatment plan with the patient. The patient was provided an opportunity to ask questions and all were answered. The patient agreed with the plan and demonstrated an understanding of the instructions.  A copy of instructions were sent to the patient via MyChart unless otherwise noted below.    The patient was advised to call back or seek an in-person evaluation if the symptoms worsen or if the condition fails to improve as anticipated.  Time:  I spent 15 minutes with the patient via telehealth technology discussing the above problems/concerns.    Apolonio Schneiders, FNP

## 2022-10-19 ENCOUNTER — Ambulatory Visit: Payer: Medicaid Other | Admitting: Family Medicine

## 2022-10-27 ENCOUNTER — Ambulatory Visit: Payer: Medicaid Other | Admitting: Family Medicine

## 2022-10-30 ENCOUNTER — Telehealth: Payer: Medicaid Other | Admitting: Family Medicine

## 2022-10-30 DIAGNOSIS — J4521 Mild intermittent asthma with (acute) exacerbation: Secondary | ICD-10-CM

## 2022-10-30 MED ORDER — ALBUTEROL SULFATE HFA 108 (90 BASE) MCG/ACT IN AERS: 2.0000 | INHALATION_SPRAY | Freq: Four times a day (QID) | RESPIRATORY_TRACT | 0 refills | Status: DC | PRN

## 2022-10-30 NOTE — Progress Notes (Signed)
Virtual Visit Consent   Jacqueline Velasquez, you are scheduled for a virtual visit with a [facility] provider today. Just as with appointments in the office, your consent must be obtained to participate. Your consent will be active for this visit and any virtual visit you may have with one of our providers in the next 365 days. If you have a MyChart account, a copy of this consent can be sent to you electronically.  As this is a virtual visit, video technology does not allow for your provider to perform a traditional examination. This may limit your provider's ability to fully assess your condition. If your provider identifies any concerns that need to be evaluated in person or the need to arrange testing (such as labs, EKG, etc.), we will make arrangements to do so. Although advances in technology are sophisticated, we cannot ensure that it will always work on either your end or our end. If the connection with a video visit is poor, the visit may have to be switched to a telephone visit. With either a video or telephone visit, we are not always able to ensure that we have a secure connection.  By engaging in this virtual visit, you consent to the provision of healthcare and authorize for your insurance to be billed (if applicable) for the services provided during this visit. Depending on your insurance coverage, you may receive a charge related to this service.  I need to obtain your verbal consent now. Are you willing to proceed with your visit today? Jacqueline Velasquez has provided verbal consent on 10/30/2022 for a virtual visit (video or telephone). Dellia Nims, FNP  Date: 10/30/2022 1:42 PM  Virtual Visit via Video Note   I, Dellia Nims, connected with  Jacqueline Velasquez  ([id number], 08/05/1995) on 10/30/22 at 12:30 PM EDT by a video-enabled telemedicine application and verified that I am speaking with the correct person using two identifiers.  Location: Patient: Virtual Visit Location Patient:  Home Provider: Virtual Visit Location Provider: Home Office   I discussed the limitations of evaluation and management by telemedicine and the availability of in person appointments. The patient expressed understanding and agreed to proceed.    History of Present Illness: Jacqueline Velasquez is a 27 y.o. who identifies as a female who was assigned female at birth, and is being seen today for refill of albuterol with asthma. Has apptmt with pcp in a month. .  HPI: HPI  Problems:  Patient Active Problem List   Diagnosis Date Noted   Major depressive disorder, recurrent episode, severe ([place]) 03/27/2019   Severe recurrent major depression without psychotic features ([place]) 03/27/2019   Mood disorder ([place]) 01/24/2012   Borderline personality disorder ([place]) 01/24/2012    Allergies:  Allergies  Allergen Reactions   Clindamycin/Lincomycin    Medications:  Current Outpatient Medications:    albuterol (VENTOLIN HFA) 108 (90 Base) MCG/ACT inhaler, Inhale 2 puffs into the lungs every 6 (six) hours as needed for wheezing or shortness of breath., Disp: 8 g, Rfl: 0   amoxicillin (AMOXIL) 500 MG capsule, Take 1 capsule (500 mg total) by mouth 3 (three) times daily., Disp: 30 capsule, Rfl: 0   amoxicillin-clavulanate (AUGMENTIN) 875-125 MG tablet, Take 1 tablet by mouth every 12 (twelve) hours., Disp: 14 tablet, Rfl: 0   cetirizine (ZYRTEC ALLERGY) 10 MG tablet, Take 1 tablet (10 mg total) by mouth daily., Disp: 30 tablet, Rfl: 2   chlorhexidine (PERIDEX) 0.12 % solution, Use as directed 15 mLs  in the mouth or throat 2 (two) times daily., Disp: 120 mL, Rfl: 0   ipratropium (ATROVENT) 0.03 % nasal spray, Place 2 sprays into both nostrils every 12 (twelve) hours., Disp: 30 mL, Rfl: 12   lidocaine (XYLOCAINE) 2 % solution, Use as directed 10 mLs in the mouth or throat every 3 (three) hours as needed for mouth pain., Disp: 100 mL, Rfl: 0   naproxen (NAPROSYN) 500 MG tablet, Take 1 tablet (500 mg total) by mouth  2 (two) times daily with a meal., Disp: 30 tablet, Rfl: 0  Observations/Objective: Patient is well-developed, well-nourished in no acute distress.  Resting comfortably  at home.  Head is normocephalic, atraumatic.  No labored breathing.  Speech is clear and coherent with logical content.  Patient is alert and oriented at baseline.    Assessment and Plan: 1. Mild intermittent asthma with acute exacerbation  Follow up with pcp, albuterol refills.   Follow Up Instructions: I discussed the assessment and treatment plan with the patient. The patient was provided an opportunity to ask questions and all were answered. The patient agreed with the plan and demonstrated an understanding of the instructions.  A copy of instructions were sent to the patient via MyChart unless otherwise noted below.     The patient was advised to call back or seek an in-person evaluation if the symptoms worsen or if the condition fails to improve as anticipated.  Time:  I spent 8 minutes with the patient via telehealth technology discussing the above problems/concerns.    Dellia Nims, FNP

## 2022-10-30 NOTE — Patient Instructions (Signed)
Asthma, Adult  Asthma is a long-term (chronic) condition that causes recurrent episodes in which the lower airways in the lungs become tight and narrow. The narrowing is caused by inflammation and tightening of the smooth muscle around the lower airways. Asthma episodes, also called asthma attacks or asthma flares, may cause coughing, making high-pitched whistling sounds when you breathe, most often when you breathe out (wheezing), shortness of breath, and chest pain. The airways may produce extra mucus caused by the inflammation and irritation. During an attack, it can be difficult to breathe. Asthma attacks can range from minor to life-threatening. Asthma cannot be cured, but medicines and lifestyle changes can help control it and treat acute attacks. It is important to keep your asthma well controlled so the condition does not interfere with your daily life. What are the causes? This condition is believed to be caused by inherited (genetic) and environmental factors, but its exact cause is not known. What can trigger an asthma attack? Many things can bring on an asthma attack or make symptoms worse. These triggers are different for every person. Common triggers include: Allergens and irritants like mold, dust, pet dander, cockroaches, pollen, air pollution, and chemical odors. Cigarette smoke. Weather changes and cold air. Stress and strong emotional responses such as crying or laughing hard. Certain medications such as aspirin or beta blockers. Infections and inflammatory conditions, such as the flu, a cold, pneumonia, or inflammation of the nasal membranes (rhinitis). Gastroesophageal reflux disease (GERD). What are the signs or symptoms? Symptoms may occur right after exposure to an asthma trigger or hours later and can vary by person. Common signs and symptoms include: Wheezing. Trouble breathing (shortness of breath). Excessive nighttime or early morning coughing. Chest  tightness. Tiredness (fatigue) with minimal activity. Difficulty talking in complete sentences. Poor exercise tolerance. How is this diagnosed? This condition is diagnosed based on: A physical exam and your medical history. Tests, which may include: Lung function studies to evaluate the flow of air in your lungs. Allergy tests. Imaging tests, such as X-rays. How is this treated? There is no cure, but symptoms can be controlled with proper treatment. Treatment usually involves: Identifying and avoiding your asthma triggers. Inhaled medicines. Two types are commonly used to treat asthma, depending on severity: Controller medicines. These help prevent asthma symptoms from occurring. They are taken every day. Fast-acting reliever or rescue medicines. These quickly relieve asthma symptoms. They are used as needed and provide short-term relief. Using other medicines, such as: Allergy medicines, such as antihistamines, if your asthma attacks are triggered by allergens. Immune medicines (immunomodulators). These are medicines that help control the immune system. Using supplemental oxygen. This is only needed during a severe episode. Creating an asthma action plan. An asthma action plan is a written plan for managing and treating your asthma attacks. This plan includes: A list of your asthma triggers and how to avoid them. Information about when medicines should be taken and when their dosage should be changed. Instructions about using a device called a peak flow meter. A peak flow meter measures how well the lungs are working and the severity of your asthma. It helps you monitor your condition. Follow these instructions at home: Take over-the-counter and prescription medicines only as told by your health care provider. Stay up to date on all vaccinations as recommended by your healthcare provider, including vaccines for the flu and pneumonia. Use a peak flow meter and keep track of your peak flow  readings. Understand and use your asthma   action plan to address any asthma flares. Do not smoke or allow anyone to smoke in your home. Contact a health care provider if: You have wheezing, shortness of breath, or a cough that is not responding to medicines. Your medicines are causing side effects, such as a rash, itching, swelling, or trouble breathing. You need to use a reliever medicine more than 2-3 times a week. Your peak flow reading is still at 50-79% of your personal best after following your action plan for 1 hour. You have a fever and shortness of breath. Get help right away if: You are getting worse and do not respond to treatment during an asthma attack. You are short of breath when at rest or when doing very little physical activity. You have difficulty eating, drinking, or talking. You have chest pain or tightness. You develop a fast heartbeat or palpitations. You have a bluish color to your lips or fingernails. You are light-headed or dizzy, or you faint. Your peak flow reading is less than 50% of your personal best. You feel too tired to breathe normally. These symptoms may be an emergency. Get help right away. Call 911. Do not wait to see if the symptoms will go away. Do not drive yourself to the hospital. Summary Asthma is a long-term (chronic) condition that causes recurrent episodes in which the airways become tight and narrow. Asthma episodes, also called asthma attacks or asthma flares, can cause coughing, wheezing, shortness of breath, and chest pain. Asthma cannot be cured, but medicines and lifestyle changes can help keep it well controlled and prevent asthma flares. Make sure you understand how to avoid triggers and how and when to use your medicines. Asthma attacks can range from minor to life-threatening. Get help right away if you have an asthma attack and do not respond to treatment with your usual rescue medicines. This information is not intended to replace  advice given to you by your health care provider. Make sure you discuss any questions you have with your health care provider. Document Revised: 05/06/2021 Document Reviewed: 04/27/2021 Elsevier Patient Education  2023 [employer]  

## 2022-12-13 ENCOUNTER — Telehealth: Payer: Medicaid Other | Admitting: Physician Assistant

## 2022-12-13 DIAGNOSIS — J4521 Mild intermittent asthma with (acute) exacerbation: Secondary | ICD-10-CM

## 2022-12-13 MED ORDER — ALBUTEROL SULFATE HFA 108 (90 BASE) MCG/ACT IN AERS: 1.0000 | INHALATION_SPRAY | Freq: Four times a day (QID) | RESPIRATORY_TRACT | 2 refills | Status: DC | PRN

## 2022-12-13 MED ORDER — PREDNISONE 50 MG PO TABS: 50.0000 mg | ORAL_TABLET | Freq: Every day | ORAL | 0 refills | Status: DC

## 2022-12-13 NOTE — Progress Notes (Signed)
Virtual Visit Consent   Jacqueline Velasquez, you are scheduled for a virtual visit with a [facility] provider today. Just as with appointments in the office, your consent must be obtained to participate. Your consent will be active for this visit and any virtual visit you may have with one of our providers in the next 365 days. If you have a MyChart account, a copy of this consent can be sent to you electronically.  As this is a virtual visit, video technology does not allow for your provider to perform a traditional examination. This may limit your provider's ability to fully assess your condition. If your provider identifies any concerns that need to be evaluated in person or the need to arrange testing (such as labs, EKG, etc.), we will make arrangements to do so. Although advances in technology are sophisticated, we cannot ensure that it will always work on either your end or our end. If the connection with a video visit is poor, the visit may have to be switched to a telephone visit. With either a video or telephone visit, we are not always able to ensure that we have a secure connection.  By engaging in this virtual visit, you consent to the provision of healthcare and authorize for your insurance to be billed (if applicable) for the services provided during this visit. Depending on your insurance coverage, you may receive a charge related to this service.  I need to obtain your verbal consent now. Are you willing to proceed with your visit today? Jacqueline Velasquez has provided verbal consent on 12/13/2022 for a virtual visit (video or telephone). [name], PA-C  Date: 12/13/2022 4:33 PM  Virtual Visit via Video Note   I, [name], connected with  Jacqueline Velasquez  ([id number], July 04, 1996) on 12/13/22 at  4:30 PM EDT by a video-enabled telemedicine application and verified that I am speaking with the correct person using two identifiers.  Location: Patient: Virtual Visit Location  Patient: Home Provider: Virtual Visit Location Provider: Home Office   I discussed the limitations of evaluation and management by telemedicine and the availability of in person appointments. The patient expressed understanding and agreed to proceed.    History of Present Illness: Jacqueline Velasquez is a 27 y.o. who identifies as a female who was assigned female at birth, and is being seen today for asthma exacerbation.  HPI: Asthma She complains of chest tightness, difficulty breathing, frequent throat clearing, shortness of breath and wheezing. This is a new problem. The current episode started 1 to 4 weeks ago. The problem occurs intermittently. The problem has been gradually worsening. Pertinent negatives include no chest pain, dyspnea on exertion, ear congestion, ear pain, fever, headaches, malaise/fatigue, myalgias, nasal congestion, orthopnea, PND, postnasal drip, sneezing, sore throat or trouble swallowing. Her symptoms are aggravated by change in weather and pollen. Her symptoms are alleviated by beta-agonist. She reports moderate improvement on treatment. Her past medical history is significant for asthma.     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/27/2019   Severe recurrent major depression without psychotic features ([facility]) 03/27/2019   Mood disorder ([facility]) 01/24/2012   Borderline personality disorder ([facility]) 01/24/2012    Allergies:  Allergies  Allergen Reactions   Clindamycin/Lincomycin    Medications:  Current Outpatient Medications:    albuterol (VENTOLIN HFA) 108 (90 Base) MCG/ACT inhaler, Inhale 1-2 puffs into the lungs every 6 (six)  hours as needed., Disp: 18 g, Rfl: 2   predniSONE (DELTASONE) 50 MG tablet, Take 1 tablet (50 mg total) by mouth daily with breakfast., Disp: 5 tablet, Rfl: 0   amoxicillin (AMOXIL) 500 MG capsule, Take 1 capsule (500 mg total) by mouth 3 (three) times daily.,  Disp: 30 capsule, Rfl: 0   amoxicillin-clavulanate (AUGMENTIN) 875-125 MG tablet, Take 1 tablet by mouth every 12 (twelve) hours., Disp: 14 tablet, Rfl: 0   cetirizine (ZYRTEC ALLERGY) 10 MG tablet, Take 1 tablet (10 mg total) by mouth daily., Disp: 30 tablet, Rfl: 2   chlorhexidine (PERIDEX) 0.12 % solution, Use as directed 15 mLs in the mouth or throat 2 (two) times daily., Disp: 120 mL, Rfl: 0   ipratropium (ATROVENT) 0.03 % nasal spray, Place 2 sprays into both nostrils every 12 (twelve) hours., Disp: 30 mL, Rfl: 12   lidocaine (XYLOCAINE) 2 % solution, Use as directed 10 mLs in the mouth or throat every 3 (three) hours as needed for mouth pain., Disp: 100 mL, Rfl: 0   naproxen (NAPROSYN) 500 MG tablet, Take 1 tablet (500 mg total) by mouth 2 (two) times daily with a meal., Disp: 30 tablet, Rfl: 0  Observations/Objective: Patient is well-developed, well-nourished in no acute distress.  Resting comfortably at home.  Head is normocephalic, atraumatic.  No labored breathing.  Speech is clear and coherent with logical content.  Patient is alert and oriented at baseline.    Assessment and Plan: 1. Mild intermittent asthma with acute exacerbation - predniSONE (DELTASONE) 50 MG tablet; Take 1 tablet (50 mg total) by mouth daily with breakfast.  Dispense: 5 tablet; Refill: 0 - albuterol (VENTOLIN HFA) 108 (90 Base) MCG/ACT inhaler; Inhale 1-2 puffs into the lungs every 6 (six) hours as needed.  Dispense: 18 g; Refill: 2  - Mild asthma exacerbation - Prednisone prescribed - Albuterol refilled x 90 days; advised to establish with PCP for future long-term asthma management - Seek in person evaluation if symptoms worsen or fail to improve  Follow Up Instructions: I discussed the assessment and treatment plan with the patient. The patient was provided an opportunity to ask questions and all were answered. The patient agreed with the plan and demonstrated an understanding of the instructions.  A  copy of instructions were sent to the patient via MyChart unless otherwise noted below.    The patient was advised to call back or seek an in-person evaluation if the symptoms worsen or if the condition fails to improve as anticipated.  Time:  I spent 8 minutes with the patient via telehealth technology discussing the above problems/concerns.    [name], PA-C

## 2022-12-13 NOTE — Patient Instructions (Signed)
Jacqueline Velasquez, thank you for joining [name], PA-C for today's virtual visit.  While this provider is not your primary care provider (PCP), if your PCP is located in our provider database this encounter information will be shared with them immediately following your visit.   A [facility] MyChart account gives you access to today's visit and all your visits, tests, and labs performed at [facility] " click here if you don't have a [facility] MyChart account or go to [email or web address]  Consent: (Patient) Jacqueline Velasquez provided verbal consent for this virtual visit at the beginning of the encounter.  Current Medications:  Current Outpatient Medications:    albuterol (VENTOLIN HFA) 108 (90 Base) MCG/ACT inhaler, Inhale 1-2 puffs into the lungs every 6 (six) hours as needed., Disp: 18 g, Rfl: 2   predniSONE (DELTASONE) 50 MG tablet, Take 1 tablet (50 mg total) by mouth daily with breakfast., Disp: 5 tablet, Rfl: 0   amoxicillin (AMOXIL) 500 MG capsule, Take 1 capsule (500 mg total) by mouth 3 (three) times daily., Disp: 30 capsule, Rfl: 0   amoxicillin-clavulanate (AUGMENTIN) 875-125 MG tablet, Take 1 tablet by mouth every 12 (twelve) hours., Disp: 14 tablet, Rfl: 0   cetirizine (ZYRTEC ALLERGY) 10 MG tablet, Take 1 tablet (10 mg total) by mouth daily., Disp: 30 tablet, Rfl: 2   chlorhexidine (PERIDEX) 0.12 % solution, Use as directed 15 mLs in the mouth or throat 2 (two) times daily., Disp: 120 mL, Rfl: 0   ipratropium (ATROVENT) 0.03 % nasal spray, Place 2 sprays into both nostrils every 12 (twelve) hours., Disp: 30 mL, Rfl: 12   lidocaine (XYLOCAINE) 2 % solution, Use as directed 10 mLs in the mouth or throat every 3 (three) hours as needed for mouth pain., Disp: 100 mL, Rfl: 0   naproxen (NAPROSYN) 500 MG tablet, Take 1 tablet (500 mg total) by mouth 2 (two) times daily with a meal., Disp: 30 tablet, Rfl: 0   Medications ordered in this encounter:  Meds  ordered this encounter  Medications   predniSONE (DELTASONE) 50 MG tablet    Sig: Take 1 tablet (50 mg total) by mouth daily with breakfast.    Dispense:  5 tablet    Refill:  0    Order Specific Question:   Supervising Provider    Answer:   Merrilee Jansky [[id number]]   albuterol (VENTOLIN HFA) 108 (90 Base) MCG/ACT inhaler    Sig: Inhale 1-2 puffs into the lungs every 6 (six) hours as needed.    Dispense:  18 g    Refill:  2    Order Specific Question:   Supervising Provider    Answer:   Merrilee Jansky [id number]     *If you need refills on other medications prior to your next appointment, please contact your pharmacy*  Follow-Up: Call back or seek an in-person evaluation if the symptoms worsen or if the condition fails to improve as anticipated.  [facility] [phone]  Other Instructions Asthma, Adult  Asthma is a long-term (chronic) condition that causes recurrent episodes in which the lower airways in the lungs become tight and narrow. The narrowing is caused by inflammation and tightening of the smooth muscle around the lower airways. Asthma episodes, also called asthma attacks or asthma flares, may cause coughing, making high-pitched whistling sounds when you breathe, most often when you breathe out (wheezing), shortness of breath, and chest pain. The airways may produce extra mucus caused by  the inflammation and irritation. During an attack, it can be difficult to breathe. Asthma attacks can range from minor to life-threatening. Asthma cannot be cured, but medicines and lifestyle changes can help control it and treat acute attacks. It is important to keep your asthma well controlled so the condition does not interfere with your daily life. What are the causes? This condition is believed to be caused by inherited (genetic) and environmental factors, but its exact cause is not known. What can trigger an asthma attack? Many things can bring on an asthma attack  or make symptoms worse. These triggers are different for every person. Common triggers include: Allergens and irritants like mold, dust, pet dander, cockroaches, pollen, air pollution, and chemical odors. Cigarette smoke. Weather changes and cold air. Stress and strong emotional responses such as crying or laughing hard. Certain medications such as aspirin or beta blockers. Infections and inflammatory conditions, such as the flu, a cold, pneumonia, or inflammation of the nasal membranes (rhinitis). Gastroesophageal reflux disease (GERD). What are the signs or symptoms? Symptoms may occur right after exposure to an asthma trigger or hours later and can vary by person. Common signs and symptoms include: Wheezing. Trouble breathing (shortness of breath). Excessive nighttime or early morning coughing. Chest tightness. Tiredness (fatigue) with minimal activity. Difficulty talking in complete sentences. Poor exercise tolerance. How is this diagnosed? This condition is diagnosed based on: A physical exam and your medical history. Tests, which may include: Lung function studies to evaluate the flow of air in your lungs. Allergy tests. Imaging tests, such as X-rays. How is this treated? There is no cure, but symptoms can be controlled with proper treatment. Treatment usually involves: Identifying and avoiding your asthma triggers. Inhaled medicines. Two types are commonly used to treat asthma, depending on severity: Controller medicines. These help prevent asthma symptoms from occurring. They are taken every day. Fast-acting reliever or rescue medicines. These quickly relieve asthma symptoms. They are used as needed and provide short-term relief. Using other medicines, such as: Allergy medicines, such as antihistamines, if your asthma attacks are triggered by allergens. Immune medicines (immunomodulators). These are medicines that help control the immune system. Using supplemental oxygen.  This is only needed during a severe episode. Creating an asthma action plan. An asthma action plan is a written plan for managing and treating your asthma attacks. This plan includes: A list of your asthma triggers and how to avoid them. Information about when medicines should be taken and when their dosage should be changed. Instructions about using a device called a peak flow meter. A peak flow meter measures how well the lungs are working and the severity of your asthma. It helps you monitor your condition. Follow these instructions at home: Take over-the-counter and prescription medicines only as told by your health care provider. Stay up to date on all vaccinations as recommended by your healthcare provider, including vaccines for the flu and pneumonia. Use a peak flow meter and keep track of your peak flow readings. Understand and use your asthma action plan to address any asthma flares. Do not smoke or allow anyone to smoke in your home. Contact a health care provider if: You have wheezing, shortness of breath, or a cough that is not responding to medicines. Your medicines are causing side effects, such as a rash, itching, swelling, or trouble breathing. You need to use a reliever medicine more than 2-3 times a week. Your peak flow reading is still at 50-79% of your personal best after following  your action plan for 1 hour. You have a fever and shortness of breath. Get help right away if: You are getting worse and do not respond to treatment during an asthma attack. You are short of breath when at rest or when doing very little physical activity. You have difficulty eating, drinking, or talking. You have chest pain or tightness. You develop a fast heartbeat or palpitations. You have a bluish color to your lips or fingernails. You are light-headed or dizzy, or you faint. Your peak flow reading is less than 50% of your personal best. You feel too tired to breathe normally. These  symptoms may be an emergency. Get help right away. Call 911. Do not wait to see if the symptoms will go away. Do not drive yourself to the hospital. Summary Asthma is a long-term (chronic) condition that causes recurrent episodes in which the airways become tight and narrow. Asthma episodes, also called asthma attacks or asthma flares, can cause coughing, wheezing, shortness of breath, and chest pain. Asthma cannot be cured, but medicines and lifestyle changes can help keep it well controlled and prevent asthma flares. Make sure you understand how to avoid triggers and how and when to use your medicines. Asthma attacks can range from minor to life-threatening. Get help right away if you have an asthma attack and do not respond to treatment with your usual rescue medicines. This information is not intended to replace advice given to you by your health care provider. Make sure you discuss any questions you have with your health care provider. Document Revised: 05/06/2021 Document Reviewed: 04/27/2021 Elsevier Patient Education  2023 Elsevier Inc.    If you have been instructed to have an in-person evaluation today at a local Urgent Care facility, please use the link below. It will take you to a list of all of our available [facility], including address, phone number and hours of operation. Please do not delay care.  [facility]  If you or a family member do not have a primary care provider, use the link below to schedule a visit and establish care. When you choose a [facility] primary care physician or advanced practice provider, you gain a long-term partner in health. Find a Primary Care Provider  Learn more about [facility]'s in-office and virtual care options: [facility] Get Care Now

## 2023-02-05 ENCOUNTER — Telehealth: Payer: Medicaid Other | Admitting: Nurse Practitioner

## 2023-02-05 DIAGNOSIS — J4521 Mild intermittent asthma with (acute) exacerbation: Secondary | ICD-10-CM | POA: Diagnosis not present

## 2023-02-05 MED ORDER — FLUTICASONE FUROATE-VILANTEROL 100-25 MCG/ACT IN AEPB: 1.0000 | INHALATION_SPRAY | Freq: Every day | RESPIRATORY_TRACT | 11 refills | Status: AC

## 2023-02-05 MED ORDER — PREDNISONE 20 MG PO TABS: 40.0000 mg | ORAL_TABLET | Freq: Every day | ORAL | 0 refills | Status: AC

## 2023-02-05 MED ORDER — ALBUTEROL SULFATE HFA 108 (90 BASE) MCG/ACT IN AERS: 1.0000 | INHALATION_SPRAY | Freq: Four times a day (QID) | RESPIRATORY_TRACT | 2 refills | Status: DC | PRN

## 2023-02-05 NOTE — Progress Notes (Signed)
Virtual Visit Consent   [name], you are scheduled for a virtual visit with Mary-Margaret Daphine Deutscher, FNP, a [facility] provider, today.     Just as with appointments in the office, your consent must be obtained to participate.  Your consent will be active for this visit and any virtual visit you may have with one of our providers in the next 365 days.     If you have a MyChart account, a copy of this consent can be sent to you electronically.  All virtual visits are billed to your insurance company just like a traditional visit in the office.    As this is a virtual visit, video technology does not allow for your provider to perform a traditional examination.  This may limit your provider's ability to fully assess your condition.  If your provider identifies any concerns that need to be evaluated in person or the need to arrange testing (such as labs, EKG, etc.), we will make arrangements to do so.     Although advances in technology are sophisticated, we cannot ensure that it will always work on either your end or our end.  If the connection with a video visit is poor, the visit may have to be switched to a telephone visit.  With either a video or telephone visit, we are not always able to ensure that we have a secure connection.     I need to obtain your verbal consent now.   Are you willing to proceed with your visit today? YES   Jacqueline Velasquez has provided verbal consent on 02/05/2023 for a virtual visit (video or telephone).   Mary-Margaret Daphine Deutscher, FNP   Date: 02/05/2023 3:49 PM   Virtual Visit via Video Note   I, Mary-Margaret Daphine Deutscher, connected with Jacqueline Velasquez ([id number], March 01, 1996) on 02/05/23 at  4:00 PM EDT by a video-enabled telemedicine application and verified that I am speaking with the correct person using two identifiers.  Location: Patient: Virtual Visit Location Patient: Home Provider: Virtual Visit Location Provider: Mobile   I discussed the limitations of  evaluation and management by telemedicine and the availability of in person appointments. The patient expressed understanding and agreed to proceed.    History of Present Illness: Jacqueline Velasquez is a 27 y.o. who identifies as a female who was assigned female at birth, and is being seen today for asthma .  HPI: Asthma She complains of hoarse voice, shortness of breath and wheezing. The current episode started in the past 7 days. Associated symptoms include dyspnea on exertion. Pertinent negatives include no chest pain. Her symptoms are aggravated by nothing. She reports minimal improvement on treatment. Her past medical history is significant for asthma.    Review of Systems  HENT:  Positive for hoarse voice.   Respiratory:  Positive for shortness of breath and wheezing.   Cardiovascular:  Positive for dyspnea on exertion. Negative for chest pain.    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/27/2019   Severe recurrent major depression without psychotic features (HCC) 03/27/2019   Mood disorder (HCC) 01/24/2012   Borderline personality disorder (HCC) 01/24/2012    Allergies:  Allergies  Allergen Reactions   Clindamycin/Lincomycin    Medications:  Current Outpatient Medications:    albuterol (VENTOLIN HFA) 108 (90 Base) MCG/ACT inhaler, Inhale 1-2 puffs into the lungs every 6 (six) hours as needed., Disp: 18 g, Rfl: 2  amoxicillin (AMOXIL) 500 MG capsule, Take 1 capsule (500 mg total) by mouth 3 (three) times daily., Disp: 30 capsule, Rfl: 0   amoxicillin-clavulanate (AUGMENTIN) 875-125 MG tablet, Take 1 tablet by mouth every 12 (twelve) hours., Disp: 14 tablet, Rfl: 0   cetirizine (ZYRTEC ALLERGY) 10 MG tablet, Take 1 tablet (10 mg total) by mouth daily., Disp: 30 tablet, Rfl: 2   chlorhexidine (PERIDEX) 0.12 % solution, Use as directed 15 mLs in the mouth or throat 2 (two) times daily., Disp: 120  mL, Rfl: 0   ipratropium (ATROVENT) 0.03 % nasal spray, Place 2 sprays into both nostrils every 12 (twelve) hours., Disp: 30 mL, Rfl: 12   lidocaine (XYLOCAINE) 2 % solution, Use as directed 10 mLs in the mouth or throat every 3 (three) hours as needed for mouth pain., Disp: 100 mL, Rfl: 0   naproxen (NAPROSYN) 500 MG tablet, Take 1 tablet (500 mg total) by mouth 2 (two) times daily with a meal., Disp: 30 tablet, Rfl: 0   predniSONE (DELTASONE) 50 MG tablet, Take 1 tablet (50 mg total) by mouth daily with breakfast., Disp: 5 tablet, Rfl: 0  Observations/Objective: Patient is well-developed, well-nourished in no acute distress.  Resting comfortably  at home.  Head is normocephalic, atraumatic.  No labored breathing.  Speech is clear and coherent with logical content.  Patient is alert and oriented at baseline.  Raspy voice Exp wheezes  Assessment and Plan:  Jacqueline Velasquez in today with chief complaint of Asthma   1. Mild intermittent asthma with acute exacerbation Only use albuterol as needed  - fluticasone furoate-vilanterol (BREO ELLIPTA) 100-25 MCG/ACT AEPB; Inhale 1 puff into the lungs daily.  Dispense: 1 each; Refill: 11 - albuterol (VENTOLIN HFA) 108 (90 Base) MCG/ACT inhaler; Inhale 1-2 puffs into the lungs every 6 (six) hours as needed.  Dispense: 18 g; Refill: 2 - predniSONE (DELTASONE) 20 MG tablet; Take 2 tablets (40 mg total) by mouth daily with breakfast for 5 days. 2 po daily for 5 days  Dispense: 10 tablet; Refill: 0    Follow Up Instructions: I discussed the assessment and treatment plan with the patient. The patient was provided an opportunity to ask questions and all were answered. The patient agreed with the plan and demonstrated an understanding of the instructions.  A copy of instructions were sent to the patient via MyChart.  The patient was advised to call back or seek an in-person evaluation if the symptoms worsen or if the condition fails to improve as  anticipated.  Time:  I spent 12 minutes with the patient via telehealth technology discussing the above problems/concerns.    Mary-Margaret Daphine Deutscher, FNP

## 2023-02-05 NOTE — Patient Instructions (Signed)
Jacqueline Velasquez, thank you for joining Bennie Pierini, FNP for today's virtual visit.  While this provider is not your primary care provider (PCP), if your PCP is located in our provider database this encounter information will be shared with them immediately following your visit.   A [facility] MyChart account gives you access to today's visit and all your visits, tests, and labs performed at [facility] " click here if you don't have a [facility] MyChart account or go to [email or web address]  Consent: (Patient) Jacqueline Velasquez provided verbal consent for this virtual visit at the beginning of the encounter.  Current Medications:  Current Outpatient Medications:    fluticasone furoate-vilanterol (BREO ELLIPTA) 100-25 MCG/ACT AEPB, Inhale 1 puff into the lungs daily., Disp: 1 each, Rfl: 11   predniSONE (DELTASONE) 20 MG tablet, Take 2 tablets (40 mg total) by mouth daily with breakfast for 5 days. 2 po daily for 5 days, Disp: 10 tablet, Rfl: 0   albuterol (VENTOLIN HFA) 108 (90 Base) MCG/ACT inhaler, Inhale 1-2 puffs into the lungs every 6 (six) hours as needed., Disp: 18 g, Rfl: 2   amoxicillin (AMOXIL) 500 MG capsule, Take 1 capsule (500 mg total) by mouth 3 (three) times daily., Disp: 30 capsule, Rfl: 0   amoxicillin-clavulanate (AUGMENTIN) 875-125 MG tablet, Take 1 tablet by mouth every 12 (twelve) hours., Disp: 14 tablet, Rfl: 0   cetirizine (ZYRTEC ALLERGY) 10 MG tablet, Take 1 tablet (10 mg total) by mouth daily., Disp: 30 tablet, Rfl: 2   chlorhexidine (PERIDEX) 0.12 % solution, Use as directed 15 mLs in the mouth or throat 2 (two) times daily., Disp: 120 mL, Rfl: 0   ipratropium (ATROVENT) 0.03 % nasal spray, Place 2 sprays into both nostrils every 12 (twelve) hours., Disp: 30 mL, Rfl: 12   lidocaine (XYLOCAINE) 2 % solution, Use as directed 10 mLs in the mouth or throat every 3 (three) hours as needed for mouth pain., Disp: 100 mL, Rfl: 0   naproxen (NAPROSYN)  500 MG tablet, Take 1 tablet (500 mg total) by mouth 2 (two) times daily with a meal., Disp: 30 tablet, Rfl: 0   Medications ordered in this encounter:  Meds ordered this encounter  Medications   fluticasone furoate-vilanterol (BREO ELLIPTA) 100-25 MCG/ACT AEPB    Sig: Inhale 1 puff into the lungs daily.    Dispense:  1 each    Refill:  11    Order Specific Question:   Supervising Provider    Answer:   Merrilee Jansky [[id number]]   albuterol (VENTOLIN HFA) 108 (90 Base) MCG/ACT inhaler    Sig: Inhale 1-2 puffs into the lungs every 6 (six) hours as needed.    Dispense:  18 g    Refill:  2    Order Specific Question:   Supervising Provider    Answer:   Merrilee Jansky [[id number]]   predniSONE (DELTASONE) 20 MG tablet    Sig: Take 2 tablets (40 mg total) by mouth daily with breakfast for 5 days. 2 po daily for 5 days    Dispense:  10 tablet    Refill:  0    Order Specific Question:   Supervising Provider    Answer:   Merrilee Jansky [id number]     *If you need refills on other medications prior to your next appointment, please contact your pharmacy*  Follow-Up: Call back or seek an in-person evaluation if the symptoms worsen or if the condition fails to improve as anticipated.  [facility] [phone]  Other Instructions Only use albuterol as needed   If you have been instructed to have an in-person evaluation today at a local Urgent Care facility, please use the link below. It will take you to a list of all of our available [facility], including address, phone number and hours of operation. Please do not delay care.  [facility]  If you or a family member do not have a primary care provider, use the link below to schedule a visit and establish care. When you choose a [facility] primary care physician or advanced practice provider, you gain a long-term partner in health. Find a Primary Care Provider  Learn more about [facility]'s  in-office and virtual care options: [facility] Get Care Now

## 2023-03-07 ENCOUNTER — Telehealth: Payer: Medicaid Other | Admitting: Nurse Practitioner

## 2023-03-07 DIAGNOSIS — K047 Periapical abscess without sinus: Secondary | ICD-10-CM | POA: Diagnosis not present

## 2023-03-07 MED ORDER — PENICILLIN V POTASSIUM 500 MG PO TABS: 500.0000 mg | ORAL_TABLET | Freq: Three times a day (TID) | ORAL | 0 refills | Status: DC

## 2023-03-07 MED ORDER — NAPROXEN 500 MG PO TABS: 500.0000 mg | ORAL_TABLET | Freq: Two times a day (BID) | ORAL | 0 refills | Status: DC

## 2023-03-07 NOTE — Progress Notes (Signed)
Virtual Visit Consent   [name], you are scheduled for a virtual visit with a [facility] provider today. Just as with appointments in the office, your consent must be obtained to participate. Your consent will be active for this visit and any virtual visit you may have with one of our providers in the next 365 days. If you have a MyChart account, a copy of this consent can be sent to you electronically.  As this is a virtual visit, video technology does not allow for your provider to perform a traditional examination. This may limit your provider's ability to fully assess your condition. If your provider identifies any concerns that need to be evaluated in person or the need to arrange testing (such as labs, EKG, etc.), we will make arrangements to do so. Although advances in technology are sophisticated, we cannot ensure that it will always work on either your end or our end. If the connection with a video visit is poor, the visit may have to be switched to a telephone visit. With either a video or telephone visit, we are not always able to ensure that we have a secure connection.  By engaging in this virtual visit, you consent to the provision of healthcare and authorize for your insurance to be billed (if applicable) for the services provided during this visit. Depending on your insurance coverage, you may receive a charge related to this service.  I need to obtain your verbal consent now. Are you willing to proceed with your visit today? Jacqueline Velasquez has provided verbal consent on 03/07/2023 for a virtual visit (video or telephone). Viviano Simas, FNP  Date: 03/07/2023 3:01 PM  Virtual Visit via Video Note   I, Viviano Simas, connected with  Jacqueline Velasquez  ([id number], [birth year]) on 03/07/23 at  3:00 PM EDT by a video-enabled telemedicine application and verified that I am speaking with the correct person using two identifiers.  Location: Patient: Virtual Visit Location Patient:  Home Provider: Virtual Visit Location Provider: Home Office   I discussed the limitations of evaluation and management by telemedicine and the availability of in person appointments. The patient expressed understanding and agreed to proceed.    History of Present Illness: Jacqueline Velasquez is a 27 y.o. who identifies as a female who was assigned female at birth, and is being seen today for pain in right upper dental area  She has pain and swelling to right upper dental area She does have a broken tooth on the right upper side and she feels the pain and swelling is around that area.   She does not have a fever  She has taken some tylenol today   She has needed antibiotics for dental infections in the past .   Currently looking for dentist     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/27/2019   Severe recurrent major depression without psychotic features (HCC) 03/27/2019   Mood disorder (HCC) 01/24/2012   Borderline personality disorder (HCC) 01/24/2012    Allergies:  Allergies  Allergen Reactions   Clindamycin/Lincomycin    Medications:  Current Outpatient Medications:    albuterol (VENTOLIN HFA) 108 (90 Base) MCG/ACT inhaler, Inhale 1-2 puffs into the lungs every 6 (six) hours as needed., Disp: 18 g, Rfl: 2   amoxicillin (AMOXIL) 500 MG capsule, Take 1 capsule (500 mg total) by mouth 3 (three) times daily., Disp: 30 capsule, Rfl: 0  amoxicillin-clavulanate (AUGMENTIN) 875-125 MG tablet, Take 1 tablet by mouth every 12 (twelve) hours., Disp: 14 tablet, Rfl: 0   cetirizine (ZYRTEC ALLERGY) 10 MG tablet, Take 1 tablet (10 mg total) by mouth daily., Disp: 30 tablet, Rfl: 2   chlorhexidine (PERIDEX) 0.12 % solution, Use as directed 15 mLs in the mouth or throat 2 (two) times daily., Disp: 120 mL, Rfl: 0   fluticasone furoate-vilanterol (BREO ELLIPTA) 100-25 MCG/ACT AEPB, Inhale 1 puff into the lungs  daily., Disp: 1 each, Rfl: 11   ipratropium (ATROVENT) 0.03 % nasal spray, Place 2 sprays into both nostrils every 12 (twelve) hours., Disp: 30 mL, Rfl: 12   lidocaine (XYLOCAINE) 2 % solution, Use as directed 10 mLs in the mouth or throat every 3 (three) hours as needed for mouth pain., Disp: 100 mL, Rfl: 0   naproxen (NAPROSYN) 500 MG tablet, Take 1 tablet (500 mg total) by mouth 2 (two) times daily with a meal., Disp: 30 tablet, Rfl: 0  Observations/Objective: Patient is well-developed, well-nourished in no acute distress.  Resting comfortably  at home.  Head is normocephalic, atraumatic.  No labored breathing.  Speech is clear and coherent with logical content.  Patient is alert and oriented at baseline.    Assessment and Plan:  1. Dental infection Follow up with a dentist in the next week as discussed   - naproxen (NAPROSYN) 500 MG tablet; Take 1 tablet (500 mg total) by mouth 2 (two) times daily with a meal for 10 days.  Dispense: 20 tablet; Refill: 0 - penicillin v potassium (VEETID) 500 MG tablet; Take 1 tablet (500 mg total) by mouth 3 (three) times daily for 10 days.  Dispense: 30 tablet; Refill: 0     Follow Up Instructions: I discussed the assessment and treatment plan with the patient. The patient was provided an opportunity to ask questions and all were answered. The patient agreed with the plan and demonstrated an understanding of the instructions.  A copy of instructions were sent to the patient via MyChart unless otherwise noted below.    The patient was advised to call back or seek an in-person evaluation if the symptoms worsen or if the condition fails to improve as anticipated.  Time:  I spent 7 minutes with the patient via telehealth technology discussing the above problems/concerns.    Viviano Simas, FNP

## 2023-03-08 ENCOUNTER — Emergency Department (HOSPITAL_COMMUNITY)
Admission: EM | Admit: 2023-03-08 | Discharge: 2023-03-08 | Disposition: A | Discharge status: Home / Self Care | Payer: Medicaid Other | Attending: Student | Admitting: Student

## 2023-03-08 ENCOUNTER — Other Ambulatory Visit: Payer: Self-pay

## 2023-03-08 ENCOUNTER — Encounter (HOSPITAL_COMMUNITY): Payer: Self-pay | Admitting: Emergency Medicine

## 2023-03-08 DIAGNOSIS — Z87891 Personal history of nicotine dependence: Secondary | ICD-10-CM | POA: Diagnosis not present

## 2023-03-08 DIAGNOSIS — R220 Localized swelling, mass and lump, head: Secondary | ICD-10-CM | POA: Diagnosis not present

## 2023-03-08 DIAGNOSIS — J45909 Unspecified asthma, uncomplicated: Secondary | ICD-10-CM | POA: Insufficient documentation

## 2023-03-08 DIAGNOSIS — K047 Periapical abscess without sinus: Secondary | ICD-10-CM | POA: Insufficient documentation

## 2023-03-08 DIAGNOSIS — Z7951 Long term (current) use of inhaled steroids: Secondary | ICD-10-CM | POA: Diagnosis not present

## 2023-03-08 DIAGNOSIS — K029 Dental caries, unspecified: Secondary | ICD-10-CM | POA: Insufficient documentation

## 2023-03-08 MED ORDER — AMOXICILLIN-POT CLAVULANATE 875-125 MG PO TABS: 1.0000 | ORAL_TABLET | Freq: Two times a day (BID) | ORAL | 0 refills | Status: DC

## 2023-03-08 MED ORDER — AMOXICILLIN-POT CLAVULANATE 875-125 MG PO TABS
1.0000 | ORAL_TABLET | Freq: Once | ORAL | Status: AC
  Administered 2023-03-08: 1 via ORAL
  Filled 2023-03-08: qty 1

## 2023-03-08 MED ORDER — NAPROXEN 500 MG PO TABS: 500.0000 mg | ORAL_TABLET | Freq: Two times a day (BID) | ORAL | 0 refills | Status: AC

## 2023-03-08 NOTE — ED Triage Notes (Signed)
Pt presents with right facial swelling, possible dental abscess.

## 2023-03-08 NOTE — ED Notes (Signed)
Introduced self to pt Pt complains of RIGHT sided facial swelling that started 2 days ago.   Pt stated that she started ABX last night at 18:00, this morning woke up and face is swollen and she is having a hard time seeing out of RIGHT eye.   Noted decaying molar on upper RIGHT side.

## 2023-03-08 NOTE — ED Provider Notes (Signed)
[place] EMERGENCY DEPARTMENT AT [facility] Provider Note  CSN: [id number] Arrival date & time: 03/08/23 1404  Chief Complaint(s) Oral Swelling  HPI Jacqueline Velasquez is a 27 y.o. female with PMH anxiety, asthma, borderline personality disorder, bipolar 1 who presents emergency department for evaluation of facial swelling and dental pain.  Over the last 2 to 3 days she has had progressive worsening pain and swelling on the right maxillary molars around tooth 2 through 4.  She had a virtual visit yesterday where she was prescribed penicillin VK and naproxen but swelling has worsened.  She does not have a dentist at this time.  Denies trismus, dysphagia, muffled voice, fever, chest pain, shortness of breath or any other systemic symptoms.   Past Medical History Past Medical History:  Diagnosis Date   Anxiety    Asthma    Bipolar 1 disorder (HCC)    Borderline personality disorder (HCC)    No pertinent past medical history    Patient Active Problem List   Diagnosis Date Noted   Major depressive disorder, recurrent episode, severe (HCC) 03/27/2019   Severe recurrent major depression without psychotic features (HCC) 03/27/2019   Mood disorder (HCC) 01/24/2012   Borderline personality disorder (HCC) 01/24/2012   Home Medication(s) Prior to Admission medications   Medication Sig Start Date End Date Taking? Authorizing Provider  amoxicillin-clavulanate (AUGMENTIN) 875-125 MG tablet Take 1 tablet by mouth every 12 (twelve) hours. 03/08/23  Yes Scorpio Fortin, MD  albuterol (VENTOLIN HFA) 108 (90 Base) MCG/ACT inhaler Inhale 1-2 puffs into the lungs every 6 (six) hours as needed. 02/05/23   Daphine Deutscher Mary-Margaret, FNP  cetirizine (ZYRTEC ALLERGY) 10 MG tablet Take 1 tablet (10 mg total) by mouth daily. 10/06/22 01/04/23  Viviano Simas, FNP  chlorhexidine (PERIDEX) 0.12 % solution Use as directed 15 mLs in the mouth or throat 2 (two) times daily. 11/30/21   Particia Nearing, PA-C   fluticasone furoate-vilanterol (BREO ELLIPTA) 100-25 MCG/ACT AEPB Inhale 1 puff into the lungs daily. 02/05/23   Daphine Deutscher, Mary-Margaret, FNP  ipratropium (ATROVENT) 0.03 % nasal spray Place 2 sprays into both nostrils every 12 (twelve) hours. 10/06/22   Viviano Simas, FNP  lidocaine (XYLOCAINE) 2 % solution Use as directed 10 mLs in the mouth or throat every 3 (three) hours as needed for mouth pain. 11/30/21   Particia Nearing, PA-C  naproxen (NAPROSYN) 500 MG tablet Take 1 tablet (500 mg total) by mouth 2 (two) times daily with a meal for 10 days. 03/08/23 03/18/23  Gyasi Hazzard, Wyn Forster, MD                                                                                                                                    Past Surgical History Past Surgical History:  Procedure Laterality Date   NO PAST SURGERIES     Family History Family History  Problem Relation Age of Onset   Asthma Mother  Social History Social History   Tobacco Use   Smoking status: Former    Current packs/day: 0.25    Average packs/day: 0.3 packs/day for 1 year (0.3 ttl pk-yrs)    Types: Cigarettes   Smokeless tobacco: Never  Vaping Use   Vaping status: Every Day  Substance Use Topics   Alcohol use: No    Comment: occ   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     Physical Exam Vital Signs  I have reviewed the triage vital signs BP 122/78   Pulse 79   Temp 98.7 F (37.1 C) (Oral)   Resp 18   Ht 5\' 7"  (1.702 m)   Wt 86.2 kg   LMP 03/03/2023 (Exact Date)   SpO2 97%   BMI 29.76 kg/m   Physical Exam Vitals and nursing note reviewed.  Constitutional:      General: She is not in acute distress.    Appearance: She is well-developed.  HENT:     Head: Normocephalic and atraumatic.     Comments: Significant dental caries teeth 2 through 4, maxillary  swelling Eyes:     Conjunctiva/sclera: Conjunctivae normal.  Cardiovascular:     Rate and Rhythm: Normal rate and regular rhythm.     Heart sounds: No murmur heard. Pulmonary:     Effort: Pulmonary effort is normal. No respiratory distress.     Breath sounds: Normal breath sounds.  Abdominal:     Palpations: Abdomen is soft.     Tenderness: There is no abdominal tenderness.  Musculoskeletal:        General: No swelling.     Cervical back: Neck supple.  Skin:    General: Skin is warm and dry.     Capillary Refill: Capillary refill takes less than 2 seconds.  Neurological:     Mental Status: She is alert.  Psychiatric:        Mood and Affect: Mood normal.     ED Results and Treatments Labs (all labs ordered are listed, but only abnormal results are displayed) Labs Reviewed - No data to display                                                                                                                        Radiology No results found.  Pertinent labs & imaging results that were available during my care of the patient were reviewed by me and considered in my medical decision making (see MDM for details).  Medications Ordered in ED Medications  amoxicillin-clavulanate (AUGMENTIN) 875-125 MG per tablet 1 tablet (1 tablet Oral Given 03/08/23 1445)  Procedures Procedures  (including critical care time)  Medical Decision Making / ED Course   This patient presents to the ED for concern of dental pain, facial swelling, this involves an extensive number of treatment options, and is a complaint that carries with it a high risk of complications and morbidity.  The differential diagnosis includes periapical abscess, pulpitis, pericoronitis, Ludwig's  MDM: Patient seen the emergency room for evaluation of facial swelling and dental pain.  Physical  exam with significant dental caries over teeth 3 through 4 with swelling up into the maxilla and around the lower orbit on the right.  We did have a shared decision-making discussion about laboratory evaluation and CT imaging, we ultimately deferred this as this likely would not change our management.  Antibiotics escalated to Augmentin and she was given resources on dentists in the area as well as the emergency dental clinic at [facility].  At this time she does not meet the criteria for admission and she is safe for discharge and outpatient follow-up.  She was given strict return precautions to which she voiced understanding and she was discharged.  Patient presentation likely consistent with periapical abscess and localized maxillary swelling.   Additional history obtained:  -External records from outside source obtained and reviewed including: Chart review including previous notes, labs, imaging, consultation notes  Medicines ordered and prescription drug management: Meds ordered this encounter  Medications   amoxicillin-clavulanate (AUGMENTIN) 875-125 MG per tablet 1 tablet   amoxicillin-clavulanate (AUGMENTIN) 875-125 MG tablet    Sig: Take 1 tablet by mouth every 12 (twelve) hours.    Dispense:  14 tablet    Refill:  0   naproxen (NAPROSYN) 500 MG tablet    Sig: Take 1 tablet (500 mg total) by mouth 2 (two) times daily with a meal for 10 days.    Dispense:  20 tablet    Refill:  0    -I have reviewed the patients home medicines and have made adjustments as needed  Critical interventions none    Social Determinants of Health:  Factors impacting patients care include: Does not have a dentist   Reevaluation: After the interventions noted above, I reevaluated the patient and found that they have :stayed the same  Co morbidities that complicate the patient evaluation  Past Medical History:  Diagnosis Date   Anxiety    Asthma    Bipolar 1 disorder (HCC)    Borderline personality  disorder (HCC)    No pertinent past medical history       Dispostion: I considered admission for this patient, but at this time she does not meet inpatient criteria for admission she is safe for discharge with outpatient follow-up     Final Clinical Impression(s) / ED Diagnoses Final diagnoses:  Dental infection  Facial swelling     @PCDICTATION @    Katie Moch, Wyn Forster, MD 03/08/23 2212

## 2023-04-08 ENCOUNTER — Telehealth: Payer: Medicaid Other | Admitting: Physician Assistant

## 2023-04-08 DIAGNOSIS — K047 Periapical abscess without sinus: Secondary | ICD-10-CM | POA: Diagnosis not present

## 2023-04-08 MED ORDER — AMOXICILLIN-POT CLAVULANATE 875-125 MG PO TABS: 1.0000 | ORAL_TABLET | Freq: Two times a day (BID) | ORAL | 0 refills | Status: DC

## 2023-04-08 MED ORDER — NAPROXEN 500 MG PO TABS: 500.0000 mg | ORAL_TABLET | Freq: Two times a day (BID) | ORAL | 0 refills | Status: AC

## 2023-04-08 NOTE — Progress Notes (Signed)
Virtual Visit Consent   LAYCEE NUMBERS, you are scheduled for a virtual visit with a [facility] provider today. Just as with appointments in the office, your consent must be obtained to participate. Your consent will be active for this visit and any virtual visit you may have with one of our providers in the next 365 days. If you have a MyChart account, a copy of this consent can be sent to you electronically.  As this is a virtual visit, video technology does not allow for your provider to perform a traditional examination. This may limit your provider's ability to fully assess your condition. If your provider identifies any concerns that need to be evaluated in person or the need to arrange testing (such as labs, EKG, etc.), we will make arrangements to do so. Although advances in technology are sophisticated, we cannot ensure that it will always work on either your end or our end. If the connection with a video visit is poor, the visit may have to be switched to a telephone visit. With either a video or telephone visit, we are not always able to ensure that we have a secure connection.  By engaging in this virtual visit, you consent to the provision of healthcare and authorize for your insurance to be billed (if applicable) for the services provided during this visit. Depending on your insurance coverage, you may receive a charge related to this service.  I need to obtain your verbal consent now. Are you willing to proceed with your visit today? Jacqueline Velasquez has provided verbal consent on 04/08/2023 for a virtual visit (video or telephone). [name], PA-C  Date: 04/08/2023 4:29 PM  Virtual Visit via Video Note   I, [name], connected with  Jacqueline Velasquez  ([id number], 1995-09-20) on 04/08/23 at  4:15 PM EDT by a video-enabled telemedicine application and verified that I am speaking with the correct person using two identifiers.  Location: Patient: Virtual Visit Location  Patient: Mobile Provider: Virtual Visit Location Provider: Home Office   I discussed the limitations of evaluation and management by telemedicine and the availability of in person appointments. The patient expressed understanding and agreed to proceed.    History of Present Illness: Jacqueline Velasquez is a 27 y.o. who identifies as a female who was assigned female at birth, and is being seen today for dental infection.  HPI: Dental Pain  This is a recurrent problem. The current episode started yesterday (Last infection was 1 month ago, treated 03/07/23 (virtually) then 03/08/23 (ER) due to worsening symptoms). The problem occurs constantly. The problem has been gradually worsening. The pain is moderate. Pertinent negatives include no facial pain, fever, sinus pressure or thermal sensitivity. She has tried acetaminophen for the symptoms. The treatment provided no relief.     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/27/2019   Severe recurrent major depression without psychotic features (HCC) 03/27/2019   Mood disorder (HCC) 01/24/2012   Borderline personality disorder (HCC) 01/24/2012    Allergies:  Allergies  Allergen Reactions   Clindamycin/Lincomycin    Medications:  Current Outpatient Medications:    amoxicillin-clavulanate (AUGMENTIN) 875-125 MG tablet, Take 1 tablet by mouth 2 (two) times daily., Disp: 14 tablet, Rfl: 0   naproxen (NAPROSYN) 500 MG tablet, Take 1 tablet (500 mg total) by mouth 2 (two) times daily with a meal., Disp: 30 tablet, Rfl: 0   albuterol (  VENTOLIN HFA) 108 (90 Base) MCG/ACT inhaler, Inhale 1-2 puffs into the lungs every 6 (six) hours as needed., Disp: 18 g, Rfl: 2   cetirizine (ZYRTEC ALLERGY) 10 MG tablet, Take 1 tablet (10 mg total) by mouth daily., Disp: 30 tablet, Rfl: 2   chlorhexidine (PERIDEX) 0.12 % solution, Use as directed 15 mLs in the mouth or throat 2 (two) times  daily., Disp: 120 mL, Rfl: 0   fluticasone furoate-vilanterol (BREO ELLIPTA) 100-25 MCG/ACT AEPB, Inhale 1 puff into the lungs daily., Disp: 1 each, Rfl: 11   ipratropium (ATROVENT) 0.03 % nasal spray, Place 2 sprays into both nostrils every 12 (twelve) hours., Disp: 30 mL, Rfl: 12   lidocaine (XYLOCAINE) 2 % solution, Use as directed 10 mLs in the mouth or throat every 3 (three) hours as needed for mouth pain., Disp: 100 mL, Rfl: 0  Observations/Objective: Patient is well-developed, well-nourished in no acute distress.  Resting comfortably  Head is normocephalic, atraumatic.  No labored breathing.  Speech is clear and coherent with logical content.  Patient is alert and oriented at baseline.    Assessment and Plan: 1. Dental infection - amoxicillin-clavulanate (AUGMENTIN) 875-125 MG tablet; Take 1 tablet by mouth 2 (two) times daily.  Dispense: 14 tablet; Refill: 0 - naproxen (NAPROSYN) 500 MG tablet; Take 1 tablet (500 mg total) by mouth 2 (two) times daily with a meal.  Dispense: 30 tablet; Refill: 0  - Suspected infection with broken tooth - Augmentin and Naproxen prescribed - Can use ice on outside jaw/cheek for swelling - Can also take tylenol for pain with other medications - Discussed DenTemp putty that can be used to cover a broken tooth - Schedule a follow with a dentist as soon as possible (Can contact [place] [facility] clinic if underinsured or uninsured) - Seek in person evaluation if symptoms fail to improve or if they worsen   Follow Up Instructions: I discussed the assessment and treatment plan with the patient. The patient was provided an opportunity to ask questions and all were answered. The patient agreed with the plan and demonstrated an understanding of the instructions.  A copy of instructions were sent to the patient via MyChart unless otherwise noted below.    The patient was advised to call back or seek an in-person evaluation if the symptoms worsen or if  the condition fails to improve as anticipated.  Time:  I spent 10 minutes with the patient via telehealth technology discussing the above problems/concerns.    [name], PA-C

## 2023-04-08 NOTE — Patient Instructions (Signed)
Jacqueline Velasquez, thank you for joining [name], PA-C for today's virtual visit.  While this provider is not your primary care provider (PCP), if your PCP is located in our provider database this encounter information will be shared with them immediately following your visit.   A [facility] MyChart account gives you access to today's visit and all your visits, tests, and labs performed at [facility] " click here if you don't have a [facility] MyChart account or go to [email or web address]  Consent: (Patient) Jacqueline Velasquez provided verbal consent for this virtual visit at the beginning of the encounter.  Current Medications:  Current Outpatient Medications:    amoxicillin-clavulanate (AUGMENTIN) 875-125 MG tablet, Take 1 tablet by mouth 2 (two) times daily., Disp: 14 tablet, Rfl: 0   naproxen (NAPROSYN) 500 MG tablet, Take 1 tablet (500 mg total) by mouth 2 (two) times daily with a meal., Disp: 30 tablet, Rfl: 0   albuterol (VENTOLIN HFA) 108 (90 Base) MCG/ACT inhaler, Inhale 1-2 puffs into the lungs every 6 (six) hours as needed., Disp: 18 g, Rfl: 2   cetirizine (ZYRTEC ALLERGY) 10 MG tablet, Take 1 tablet (10 mg total) by mouth daily., Disp: 30 tablet, Rfl: 2   chlorhexidine (PERIDEX) 0.12 % solution, Use as directed 15 mLs in the mouth or throat 2 (two) times daily., Disp: 120 mL, Rfl: 0   fluticasone furoate-vilanterol (BREO ELLIPTA) 100-25 MCG/ACT AEPB, Inhale 1 puff into the lungs daily., Disp: 1 each, Rfl: 11   ipratropium (ATROVENT) 0.03 % nasal spray, Place 2 sprays into both nostrils every 12 (twelve) hours., Disp: 30 mL, Rfl: 12   lidocaine (XYLOCAINE) 2 % solution, Use as directed 10 mLs in the mouth or throat every 3 (three) hours as needed for mouth pain., Disp: 100 mL, Rfl: 0   Medications ordered in this encounter:  Meds ordered this encounter  Medications   amoxicillin-clavulanate (AUGMENTIN) 875-125 MG tablet    Sig: Take 1 tablet by mouth 2  (two) times daily.    Dispense:  14 tablet    Refill:  0    Order Specific Question:   Supervising Provider    Answer:   Merrilee Jansky [[id number]]   naproxen (NAPROSYN) 500 MG tablet    Sig: Take 1 tablet (500 mg total) by mouth 2 (two) times daily with a meal.    Dispense:  30 tablet    Refill:  0    Order Specific Question:   Supervising Provider    Answer:   Merrilee Jansky [id number]     *If you need refills on other medications prior to your next appointment, please contact your pharmacy*  Follow-Up: Call back or seek an in-person evaluation if the symptoms worsen or if the condition fails to improve as anticipated.   Virtual Care [phone]  Other Instructions Dental Abscess  A dental abscess is an infection around a tooth that may involve pain, swelling, and a collection of pus, as well as other symptoms. Treatment is important to help with symptoms and to prevent the infection from spreading. The general types of dental abscesses are: Pulpal abscess. This abscess may form from the inner part of the tooth (pulp). Periodontal abscess. This abscess may form from the gum. What are the causes? This condition is caused by a bacterial infection in or around the tooth. It may result from: Severe tooth decay (cavities). Trauma to the tooth, such as a broken or chipped tooth. What increases  the risk? This condition is more likely to develop in males. It is also more likely to develop in people who: Have cavities. Have severe gum disease. Eat sugary snacks between meals. Use tobacco products. Have diabetes. Have a weakened disease-fighting system (immune system). Do not brush and care for their teeth regularly. What are the signs or symptoms? Mild symptoms of this condition include: Tenderness. Bad breath. Fever. A bitter taste in the mouth. Pain in and around the infected tooth. Moderate symptoms of this condition include: Swollen neck  glands. Chills. Pus drainage. Swelling and redness around the infected tooth, in the mouth, or in the face. Severe pain in and around the infected tooth. Severe symptoms of this condition include: Difficulty swallowing. Difficulty opening the mouth. Nausea. Vomiting. How is this diagnosed? This condition is diagnosed based on: Your symptoms and your medical and dental history. An examination of the infected tooth. During the exam, your dental care provider may tap on the infected tooth. You may also need to have X-rays taken of the affected area. How is this treated? This condition is treated by getting rid of the infection. This may be done with: Antibiotic medicines. These may be used in certain situations. Antibacterial mouth rinse. Incision and drainage. This procedure is done by making an incision in the abscess to drain out the pus. Removing pus is the first priority in treating an abscess. A root canal. This may be performed to save the tooth. Your dental care provider accesses the visible part of your tooth (crown) with a drill and removes any infected pulp. Then the space is filled and sealed off. Tooth extraction. The tooth is pulled out if it cannot be saved by other treatment. You may also receive treatment for pain, such as: Acetaminophen or NSAIDs. Gels that contain a numbing medicine. An injection to block the pain near your nerve. Follow these instructions at home: Medicines Take over-the-counter and prescription medicines only as told by your dental care provider. If you were prescribed an antibiotic, take it as told by your dental care provider. Do not stop taking the antibiotic even if you start to feel better. If you were prescribed a gel that contains a numbing medicine, use it exactly as told in the directions. Do not use these gels for children who are younger than 75 years of age. Use an antibacterial mouth rinse as told by your dental care provider. General  instructions  Gargle with a mixture of salt and water 3-4 times a day or as needed. To make salt water, completely dissolve -1 tsp (3-6 g) of salt in 1 cup (237 mL) of warm water. Eat a soft diet while your abscess is healing. Drink enough fluid to keep your urine pale yellow. Do not apply heat to the outside of your mouth. Do not use any products that contain nicotine or tobacco. These products include cigarettes, chewing tobacco, and vaping devices, such as e-cigarettes. If you need help quitting, ask your dental care provider. Keep all follow-up visits. This is important. How is this prevented?  Excellent dental home care, which includes brushing your teeth every morning and night with fluoride toothpaste. Floss one time each day. Get regularly scheduled dental cleanings. Consider having a dental sealant applied on teeth that have deep grooves to prevent cavities. Drink fluoridated water regularly. This includes most tap water. Check the label on bottled water to see if it contains fluoride. Reduce or eliminate sugary drinks. Eat healthy meals and snacks. Wear a mouth  guard or face shield to protect your teeth while playing sports. Contact a health care provider if: Your pain is worse and is not helped by medicine. You have swelling. You see pus around the tooth. You have a fever or chills. Get help right away if: Your symptoms suddenly get worse. You have a very bad headache. You have problems breathing or swallowing. You have trouble opening your mouth. You have swelling in your neck or around your eye. These symptoms may represent a serious problem that is an emergency. Do not wait to see if the symptoms will go away. Get medical help right away. Call your local emergency services (911 in the [place] Do not drive yourself to the hospital. Summary A dental abscess is a collection of pus in or around a tooth that results from an infection. A dental abscess may result from severe  tooth decay, trauma to the tooth, or severe gum disease around a tooth. Symptoms include severe pain, swelling, redness, and drainage of pus in and around the infected tooth. The first priority in treating a dental abscess is to drain out the pus. Treatment may also involve removing damage inside the tooth (root canal) or extracting the tooth. This information is not intended to replace advice given to you by your health care provider. Make sure you discuss any questions you have with your health care provider. Document Revised: 09/25/2020 Document Reviewed: 09/25/2020 Elsevier Patient Education  2024 Elsevier Inc.    If you have been instructed to have an in-person evaluation today at a local Urgent Care facility, please use the link below. It will take you to a list of all of our available [facility], including address, phone number and hours of operation. Please do not delay care.  [facility]  If you or a family member do not have a primary care provider, use the link below to schedule a visit and establish care. When you choose a [facility] primary care physician or advanced practice provider, you gain a long-term partner in health. Find a Primary Care Provider  Learn more about [facility]'s in-office and virtual care options:  - Get Care Now

## 2023-05-12 ENCOUNTER — Other Ambulatory Visit: Payer: Self-pay | Admitting: Nurse Practitioner

## 2023-05-12 DIAGNOSIS — J4521 Mild intermittent asthma with (acute) exacerbation: Secondary | ICD-10-CM

## 2023-05-20 ENCOUNTER — Telehealth: Payer: Medicaid Other | Admitting: Physician Assistant

## 2023-05-20 DIAGNOSIS — J4521 Mild intermittent asthma with (acute) exacerbation: Secondary | ICD-10-CM | POA: Diagnosis not present

## 2023-05-20 MED ORDER — ALBUTEROL SULFATE HFA 108 (90 BASE) MCG/ACT IN AERS: 1.0000 | INHALATION_SPRAY | Freq: Four times a day (QID) | RESPIRATORY_TRACT | 0 refills | Status: AC | PRN

## 2023-05-20 MED ORDER — ALBUTEROL SULFATE HFA 108 (90 BASE) MCG/ACT IN AERS: 1.0000 | INHALATION_SPRAY | Freq: Four times a day (QID) | RESPIRATORY_TRACT | 0 refills | Status: DC | PRN

## 2023-05-20 NOTE — Addendum Note (Signed)
Addended by: [name] on: 05/20/2023 02:43 PM   Modules accepted: Orders

## 2023-05-20 NOTE — Progress Notes (Signed)

## 2023-06-07 ENCOUNTER — Telehealth: Payer: Medicaid Other | Admitting: Physician Assistant

## 2023-06-07 DIAGNOSIS — J4541 Moderate persistent asthma with (acute) exacerbation: Secondary | ICD-10-CM

## 2023-06-07 MED ORDER — PREDNISONE 20 MG PO TABS: 40.0000 mg | ORAL_TABLET | Freq: Every day | ORAL | 0 refills | Status: DC

## 2023-06-07 NOTE — Progress Notes (Signed)
E-Visit for Asthma  Based on what you have shared with me, it looks like you may have a flare up of your asthma.  Asthma is a chronic (ongoing) lung disease which results in airway obstruction, inflammation and hyper-responsiveness.   Asthma symptoms vary from person to person, with common symptoms including nighttime awakening and decreased ability to participate in normal activities as a result of shortness of breath. It is often triggered by changes in weather, changes in the season, changes in air temperature, or inside (home, school, daycare or work) allergens such as animal dander, mold, mildew, woodstoves or cockroaches.   It can also be triggered by hormonal changes, extreme emotion, physical exertion or an upper respiratory tract illness.     It is important to identify the trigger, and then eliminate or avoid the trigger if possible.   If you have been prescribed medications to be taken on a regular basis, it is important to follow the asthma action plan and to follow guidelines to adjust medication in response to increasing symptoms of decreased peak expiratory flow rate  Treatment: I have prescribed: Prednisone 40mg by mouth per day for 5 - 7 days  HOME CARE Only take medications as instructed by your medical team. Consider wearing a mask or scarf to improve breathing air temperature have been shown to decrease irritation and decrease exacerbations Get rest. Taking a steamy shower or using a humidifier may help nasal congestion sand ease sore throat pain. You can place a towel over your head and breathe in the steam from hot water coming from a faucet. Using a saline nasal spray works much the same way.  Cough drops, hare candies and sore throat lozenges may ease your cough.  Avoid close contacts especially the very you and the elderly Cover your mouth if you cough or  sneeze Always remember to wash your hands.    GET HELP RIGHT AWAY IF: You develop worsening symptoms; breathlessness at rest, drowsy, confused or agitated, unable to speak in full sentences You have coughing fits You develop a severe headache or visual changes You develop shortness of breath, difficulty breathing or start having chest pain Your symptoms persist after you have completed your treatment plan If your symptoms do not improve within 10 days  MAKE SURE YOU Understand these instructions. Will watch your condition. Will get help right away if you are not doing well or get worse.   Your e-visit answers were reviewed by a board certified advanced clinical practitioner to complete your personal care plan, Depending upon the condition, your plan could have included both over the counter or prescription medications.   Please review your pharmacy choice. Your safety is important to us. If you have drug allergies check your prescription carefully.  You can use MyChart to ask questions about today's visit, request a non-urgent  call back, or ask for a work or school excuse for 24 hours related to this e-Visit. If it has been greater than 24 hours you will need to follow up with your provider, or enter a new e-Visit to address those concerns.   You will get an e-mail in the next two days asking about your experience. I hope that your e-visit has been valuable and will speed your recovery. Thank you for using e-visits.  I have spent 5 minutes in review of e-visit questionnaire, review and updating patient chart, medical decision making and response to patient.   Arnetta Odeh M Maris Bena, PA-C  

## 2023-06-21 ENCOUNTER — Telehealth: Payer: Medicaid Other | Admitting: Physician Assistant

## 2023-06-21 DIAGNOSIS — J45909 Unspecified asthma, uncomplicated: Secondary | ICD-10-CM

## 2023-06-21 NOTE — Progress Notes (Signed)
Because of ongoing issue with uncontrolled asthma and frequent exacerbations, as well as need for maintenance inhaler ongoing which we cannot prescribe via e-visit, I feel your condition warrants further evaluation and I recommend that you be seen in a face to face visit. I recommend in person for lung examination -- please see below.    NOTE: There will be NO CHARGE for this eVisit   If you are having a true medical emergency please call 911.      For an urgent face to face visit, [facility] has eight urgent care centers for your convenience:   NEW!! [facility] Center at [facility] Get Driving Directions [phone] [address] [place], [address]    [facility] Health Urgent Care Center at [facility] Get Driving Directions [phone] [address] [place], [place] [address]   [facility] Health Urgent Care Center [facility]) Get Driving Directions [phone] [address] [place], [place] [address]  [facility] Center [facility]) Get Driving Directions [phone] [address] [place],  [place]  [address]  [facility] Urgent [facility] Center [facility] - at [employer]  [phone] [address] [place],  [place] [address]   [facility] Health Urgent Care at [facility] Get Driving Directions [phone] [address] [place], [place] [address]   [facility] Health Urgent Care at [facility] Get Driving Directions  [phone] [address] [place], [place] [address]   [facility] Health Urgent Care at [facility] Directions [phone] [address], [address] [place], [place] [address]  Your MyChart E-visit questionnaire answers were reviewed by a board certified advanced clinical practitioner to complete your personal care plan based on your specific symptoms.  Thank you for using e-Visits.

## 2023-07-14 ENCOUNTER — Emergency Department (HOSPITAL_BASED_OUTPATIENT_CLINIC_OR_DEPARTMENT_OTHER): Payer: Medicaid Other

## 2023-07-14 ENCOUNTER — Other Ambulatory Visit: Payer: Self-pay

## 2023-07-14 ENCOUNTER — Emergency Department (HOSPITAL_BASED_OUTPATIENT_CLINIC_OR_DEPARTMENT_OTHER)
Admission: EM | Admit: 2023-07-14 | Discharge: 2023-07-14 | Disposition: A | Discharge status: Home / Self Care | Payer: Medicaid Other | Attending: Emergency Medicine | Admitting: Emergency Medicine

## 2023-07-14 ENCOUNTER — Telehealth: Payer: Medicaid Other | Admitting: Physician Assistant

## 2023-07-14 DIAGNOSIS — J45901 Unspecified asthma with (acute) exacerbation: Secondary | ICD-10-CM | POA: Insufficient documentation

## 2023-07-14 DIAGNOSIS — Z20822 Contact with and (suspected) exposure to covid-19: Secondary | ICD-10-CM | POA: Insufficient documentation

## 2023-07-14 DIAGNOSIS — J988 Other specified respiratory disorders: Secondary | ICD-10-CM | POA: Insufficient documentation

## 2023-07-14 DIAGNOSIS — R0602 Shortness of breath: Secondary | ICD-10-CM | POA: Diagnosis present

## 2023-07-14 LAB — RESP PANEL BY RT-PCR (RSV, FLU A&B, COVID)  RVPGX2
Influenza A by PCR: NEGATIVE
Influenza B by PCR: NEGATIVE
Resp Syncytial Virus by PCR: NEGATIVE
SARS Coronavirus 2 by RT PCR: NEGATIVE

## 2023-07-14 MED ORDER — ALBUTEROL SULFATE (2.5 MG/3ML) 0.083% IN NEBU
2.5000 mg | INHALATION_SOLUTION | Freq: Once | RESPIRATORY_TRACT | Status: AC
  Administered 2023-07-14: 2.5 mg via RESPIRATORY_TRACT
  Filled 2023-07-14: qty 3

## 2023-07-14 MED ORDER — PREDNISONE 20 MG PO TABS: 40.0000 mg | ORAL_TABLET | Freq: Every day | ORAL | 0 refills | Status: AC

## 2023-07-14 MED ORDER — ALBUTEROL SULFATE HFA 108 (90 BASE) MCG/ACT IN AERS
2.0000 | INHALATION_SPRAY | RESPIRATORY_TRACT | Status: DC | PRN
  Administered 2023-07-14: 2 via RESPIRATORY_TRACT
  Filled 2023-07-14: qty 6.7

## 2023-07-14 MED ORDER — PREDNISONE 50 MG PO TABS
60.0000 mg | ORAL_TABLET | Freq: Once | ORAL | Status: AC
  Administered 2023-07-14: 60 mg via ORAL
  Filled 2023-07-14: qty 1

## 2023-07-14 MED ORDER — BENZONATATE 100 MG PO CAPS: 100.0000 mg | ORAL_CAPSULE | Freq: Three times a day (TID) | ORAL | 0 refills | Status: AC

## 2023-07-14 MED ORDER — IPRATROPIUM-ALBUTEROL 0.5-2.5 (3) MG/3ML IN SOLN
3.0000 mL | Freq: Once | RESPIRATORY_TRACT | Status: AC
  Administered 2023-07-14: 3 mL via RESPIRATORY_TRACT
  Filled 2023-07-14: qty 3

## 2023-07-14 MED ORDER — PREDNISONE 20 MG PO TABS: 40.0000 mg | ORAL_TABLET | Freq: Every day | ORAL | 0 refills | Status: DC

## 2023-07-14 MED ORDER — ALBUTEROL SULFATE HFA 108 (90 BASE) MCG/ACT IN AERS: 2.0000 | INHALATION_SPRAY | Freq: Four times a day (QID) | RESPIRATORY_TRACT | 0 refills | Status: DC | PRN

## 2023-07-14 NOTE — Discharge Instructions (Addendum)
Please read and follow all provided instructions.  Your diagnoses today include:  1. Exacerbation of asthma, unspecified asthma severity, unspecified whether persistent   2. Respiratory infection     Tests performed today include: Chest x-ray : No pneumonia Flu, COVID, RSV testing: was negative Vital signs. See below for your results today.   Medications prescribed:  Albuterol inhaler - medication that opens up your airway  Use inhaler as follows: 1-2 puffs with spacer every 4 hours as needed for wheezing, cough, or shortness of breath.   Prednisone - steroid medicine   It is best to take this medication in the morning to prevent sleeping problems. If you are diabetic, monitor your blood sugar closely and stop taking Prednisone if blood sugar is over 300. Take with food to prevent stomach upset.   Take any prescribed medications only as directed.  Home care instructions:  Follow any educational materials contained in this packet.  Follow-up instructions: Please follow-up with your primary care provider in the next 3-5 days for further evaluation of your symptoms and management of your asthma.  Return instructions:  Please return to the Emergency Department if you experience worsening symptoms. Please return with worsening wheezing, shortness of breath, or difficulty breathing. Return with persistent fever above 101F.  Please return if you have any other emergent concerns.  Additional Information:  Your vital signs today were: BP 136/78   Pulse 93   Temp 98.4 F (36.9 C) (Oral)   Resp 16   LMP 07/14/2023   SpO2 99%  If your blood pressure (BP) was elevated above 135/85 this visit, please have this repeated by your doctor within one month. --------------

## 2023-07-14 NOTE — ED Provider Notes (Signed)
[facility] AT [facility] Provider Note   CSN: [id number] Arrival date & time: 07/14/23  1851     History  Chief Complaint  Patient presents with   Shortness of Breath    Jacqueline Velasquez is a 27 y.o. female.  Patient with history of asthma presents to the emergency department today for evaluation of cough and chest congestion with worsening wheezing and shortness of breath over the past 3 days.  She denies fevers.  No nausea or vomiting.  Symptoms have been poorly controlled with home medications.  No known sick contacts.  She has not been seen for this prior.  She had a televisit earlier today and was prescribed steroids but has been unable to pick them up.      Home Medications Prior to Admission medications   Medication Sig Start Date End Date Taking? Authorizing Provider  albuterol (PROAIR HFA) 108 (90 Base) MCG/ACT inhaler Inhale 1-2 puffs into the lungs every 6 (six) hours as needed for wheezing or shortness of breath. 05/20/23   [name], PA-C  albuterol (VENTOLIN HFA) 108 (90 Base) MCG/ACT inhaler Inhale 2 puffs into the lungs every 6 (six) hours as needed for wheezing or shortness of breath. 07/14/23   Couture, Cortni S, PA-C  amoxicillin-clavulanate (AUGMENTIN) 875-125 MG tablet Take 1 tablet by mouth 2 (two) times daily. 04/08/23   [name], PA-C  cetirizine (ZYRTEC ALLERGY) 10 MG tablet Take 1 tablet (10 mg total) by mouth daily. 10/06/22 01/04/23  Viviano Simas, FNP  chlorhexidine (PERIDEX) 0.12 % solution Use as directed 15 mLs in the mouth or throat 2 (two) times daily. 11/30/21   Particia Nearing, PA-C  fluticasone furoate-vilanterol (BREO ELLIPTA) 100-25 MCG/ACT AEPB Inhale 1 puff into the lungs daily. 02/05/23   Daphine Deutscher, Mary-Margaret, FNP  ipratropium (ATROVENT) 0.03 % nasal spray Place 2 sprays into both nostrils every 12 (twelve) hours. 10/06/22   Viviano Simas, FNP  lidocaine (XYLOCAINE) 2 % solution Use as directed  10 mLs in the mouth or throat every 3 (three) hours as needed for mouth pain. 11/30/21   Particia Nearing, PA-C  naproxen (NAPROSYN) 500 MG tablet Take 1 tablet (500 mg total) by mouth 2 (two) times daily with a meal. 04/08/23   Burnette, Alessandra Bevels, PA-C  predniSONE (DELTASONE) 20 MG tablet Take 2 tablets (40 mg total) by mouth daily with breakfast. 06/07/23   [name], PA-C  predniSONE (DELTASONE) 20 MG tablet Take 2 tablets (40 mg total) by mouth daily for 5 days. 07/14/23 07/19/23  Couture, Cortni S, PA-C      Allergies    Clindamycin/lincomycin    Review of Systems   Review of Systems  Physical Exam Updated Vital Signs BP (!) 158/88 (BP Location: Right Arm)   Pulse 92   Temp 98.1 F (36.7 C)   Resp (!) 22   LMP 07/14/2023   SpO2 99%  Physical Exam Vitals and nursing note reviewed.  Constitutional:      General: She is not in acute distress.    Appearance: She is well-developed.  HENT:     Head: Normocephalic and atraumatic.     Right Ear: External ear normal.     Left Ear: External ear normal.     Nose: Nose normal.  Eyes:     Conjunctiva/sclera: Conjunctivae normal.  Cardiovascular:     Rate and Rhythm: Normal rate and regular rhythm.     Heart sounds: No murmur heard. Pulmonary:  Effort: No respiratory distress.     Breath sounds: Wheezing present. No rhonchi or rales.     Comments: Scattered expiratory wheezes, all fields.  Mildly increased work of breathing but not in distress.  Patient receiving breathing treatment at time of exam. Abdominal:     Palpations: Abdomen is soft.     Tenderness: There is no abdominal tenderness. There is no guarding or rebound.  Musculoskeletal:     Cervical back: Normal range of motion and neck supple.     Right lower leg: No edema.     Left lower leg: No edema.  Skin:    General: Skin is warm and dry.     Findings: No rash.  Neurological:     General: No focal deficit present.     Mental Status: She is alert.  Mental status is at baseline.     Motor: No weakness.  Psychiatric:        Mood and Affect: Mood normal.    ED Results / Procedures / Treatments   Labs (all labs ordered are listed, but only abnormal results are displayed) Labs Reviewed  RESP PANEL BY RT-PCR (RSV, FLU A&B, COVID)  [id number]    EKG EKG Interpretation Date/Time:  Thursday July 14 2023 19:02:59 EST Ventricular Rate:  83 PR Interval:  130 QRS Duration:  82 QT Interval:  352 QTC Calculation: 413 R Axis:   58  Text Interpretation: Normal sinus rhythm Normal ECG Confirmed by Vonita Moss [phone]) on 07/14/2023 7:47:25 PM  Radiology DG Chest Portable 1 View Result Date: 07/14/2023 CLINICAL DATA:  Shortness of breath, cough EXAM: PORTABLE CHEST 1 VIEW COMPARISON:  09/04/2018 FINDINGS: Lungs are clear.  No pleural effusion or pneumothorax. Heart is normal in size. IMPRESSION: No acute cardiopulmonary disease. Electronically Signed   By: Charline Bills M.D.   On: 07/14/2023 19:41    Procedures Procedures    Medications Ordered in ED Medications  albuterol (VENTOLIN HFA) 108 (90 Base) MCG/ACT inhaler 2 puff (has no administration in time range)  ipratropium-albuterol (DUONEB) 0.5-2.5 (3) MG/3ML nebulizer solution 3 mL (3 mLs Nebulization Given 07/14/23 1921)  albuterol (PROVENTIL) (2.5 MG/3ML) 0.083% nebulizer solution 2.5 mg (2.5 mg Nebulization Given 07/14/23 1921)  predniSONE (DELTASONE) tablet 60 mg (60 mg Oral Given 07/14/23 2039)    ED Course/ Medical Decision Making/ A&P    Patient seen and examined. History obtained directly from patient.   Labs/EKG: Ordered respiratory viral panel  Imaging: Chest x-ray ordered in triage on arrival, agree no pneumonia.  Medications/Fluids: Ordered: Albuterol Atrovent, prednisone  Most recent vital signs reviewed and are as follows: BP (!) 158/88 (BP Location: Right Arm)   Pulse 92   Temp 98.1 F (36.7 C)   Resp (!) 22   LMP 07/14/2023   SpO2 99%    Initial impression: Respiratory infection with asthma exacerbation  9:37 PM Reassessment performed. Patient appears comfortable.  She states that subjectively if she feels improved.  On reexamination, she has some faint expiratory wheezing, but improved from previous.  No increased work of breathing.  Labs personally reviewed and interpreted including: Respiratory panel negative.  Reviewed pertinent lab work and imaging with patient at bedside. Questions answered.   Most current vital signs reviewed and are as follows: BP 136/78   Pulse 93   Temp 98.4 F (36.9 C) (Oral)   Resp 16   LMP 07/14/2023   SpO2 99%   Plan: Discharge to home.  Will provide with albuterol inhaler.  Instructed to  use 2 puffs every 4 hours for the next 24 hours and then as needed.  Prescriptions written for: Prednisone x 4 days, Tessalon  Other home care instructions discussed: Continued use of albuterol, avoidance of triggers, OTC meds as needed  ED return instructions discussed: Worsening shortness of breath, increased work of breathing, new or worsening symptoms  Follow-up instructions discussed: Patient encouraged to follow-up with their PCP in 3 to 5 days as needed to discuss asthma control and if not feeling better.                                Medical Decision Making Amount and/or Complexity of Data Reviewed Radiology: ordered.  Risk Prescription drug management.   Patient presents with URI symptoms in setting of asthma.  Flu, COVID, RSV negative.  She feels better after breathing treatment.  Will continue albuterol at home and 5-day burst of steroids.  Patient clinically improved in the ED.  No increased work of breathing.  She is comfortable discharge to home.  Chest x-ray without signs of pneumonia or other abnormality.  The patient's vital signs, pertinent lab work and imaging were reviewed and interpreted as discussed in the ED course. Hospitalization was considered for further testing,  treatments, or serial exams/observation. However as patient is well-appearing, has a stable exam, and reassuring studies today, I do not feel that they warrant admission at this time. This plan was discussed with the patient who verbalizes agreement and comfort with this plan and seems reliable and able to return to the Emergency Department with worsening or changing symptoms.           Final Clinical Impression(s) / ED Diagnoses Final diagnoses:  Exacerbation of asthma, unspecified asthma severity, unspecified whether persistent  Respiratory infection    Rx / DC Orders ED Discharge Orders          Ordered    predniSONE (DELTASONE) 20 MG tablet  Daily        07/14/23 2136    benzonatate (TESSALON) 100 MG capsule  Every 8 hours        07/14/23 2136              Renne Crigler, Cordelia Poche 07/14/23 2139    Rondel Baton, MD 07/16/23 1332

## 2023-07-14 NOTE — Progress Notes (Signed)

## 2023-07-14 NOTE — ED Triage Notes (Signed)
HX asthma. Using rescue inhaler at home. No relief. Has steroid prescribed but could not pick up yet due to SOB. Recent illness/cough leading up to this.

## 2023-08-23 ENCOUNTER — Telehealth: Payer: Medicaid Other | Admitting: Physician Assistant

## 2023-08-23 DIAGNOSIS — J45909 Unspecified asthma, uncomplicated: Secondary | ICD-10-CM

## 2023-08-23 NOTE — Progress Notes (Signed)
  Because of frequent exacerbations of asthma, especially in the past few months, with multiple visits, I feel your condition warrants further evaluation and I recommend that you be seen in a face-to-face visit.   NOTE: There will be NO CHARGE for this E-Visit   If you are having a true medical emergency, please call 911.     For an urgent face to face visit, [place] has multiple urgent care centers for your convenience.  Click the link below for the full list of locations and hours, walk-in wait times, appointment scheduling options and driving directions:  Urgent Care - [place], [place], [place], [place], [place], [place]  [place]     Your MyChart E-visit questionnaire answers were reviewed by a board certified advanced clinical practitioner to complete your personal care plan based on your specific symptoms.    Thank you for using e-Visits.

## 2023-08-27 ENCOUNTER — Telehealth: Payer: Medicaid Other | Admitting: Nurse Practitioner

## 2023-08-27 DIAGNOSIS — J45901 Unspecified asthma with (acute) exacerbation: Secondary | ICD-10-CM | POA: Diagnosis not present

## 2023-08-27 MED ORDER — ALBUTEROL SULFATE HFA 108 (90 BASE) MCG/ACT IN AERS
2.0000 | INHALATION_SPRAY | Freq: Four times a day (QID) | RESPIRATORY_TRACT | 0 refills | Status: AC | PRN
Start: 2023-08-27 — End: ?

## 2023-08-27 MED ORDER — BUDESONIDE-FORMOTEROL FUMARATE 80-4.5 MCG/ACT IN AERO: 2.0000 | INHALATION_SPRAY | Freq: Two times a day (BID) | RESPIRATORY_TRACT | 0 refills | Status: AC

## 2023-08-27 NOTE — Patient Instructions (Signed)
Jacqueline Velasquez, thank you for joining Claiborne Rigg, NP for today's virtual visit.  While this provider is not your primary care provider (PCP), if your PCP is located in our provider database this encounter information will be shared with them immediately following your visit.   A [facility] MyChart account gives you access to today's visit and all your visits, tests, and labs performed at [facility] " click here if you don't have a [facility] MyChart account or go to [email or web address]  Consent: (Patient) Jacqueline Velasquez provided verbal consent for this virtual visit at the beginning of the encounter.  Current Medications:  Current Outpatient Medications:    budesonide-formoterol (SYMBICORT) 80-4.5 MCG/ACT inhaler, Inhale 2 puffs into the lungs 2 (two) times daily., Disp: 1 each, Rfl: 0   albuterol (PROAIR HFA) 108 (90 Base) MCG/ACT inhaler, Inhale 1-2 puffs into the lungs every 6 (six) hours as needed for wheezing or shortness of breath., Disp: 8 g, Rfl: 0   albuterol (VENTOLIN HFA) 108 (90 Base) MCG/ACT inhaler, Inhale 2 puffs into the lungs every 6 (six) hours as needed for wheezing or shortness of breath., Disp: 8 g, Rfl: 0   amoxicillin-clavulanate (AUGMENTIN) 875-125 MG tablet, Take 1 tablet by mouth 2 (two) times daily., Disp: 14 tablet, Rfl: 0   benzonatate (TESSALON) 100 MG capsule, Take 1 capsule (100 mg total) by mouth every 8 (eight) hours., Disp: 15 capsule, Rfl: 0   cetirizine (ZYRTEC ALLERGY) 10 MG tablet, Take 1 tablet (10 mg total) by mouth daily., Disp: 30 tablet, Rfl: 2   chlorhexidine (PERIDEX) 0.12 % solution, Use as directed 15 mLs in the mouth or throat 2 (two) times daily., Disp: 120 mL, Rfl: 0   fluticasone furoate-vilanterol (BREO ELLIPTA) 100-25 MCG/ACT AEPB, Inhale 1 puff into the lungs daily., Disp: 1 each, Rfl: 11   ipratropium (ATROVENT) 0.03 % nasal spray, Place 2 sprays into both nostrils every 12 (twelve) hours., Disp: 30 mL, Rfl: 12    lidocaine (XYLOCAINE) 2 % solution, Use as directed 10 mLs in the mouth or throat every 3 (three) hours as needed for mouth pain., Disp: 100 mL, Rfl: 0   naproxen (NAPROSYN) 500 MG tablet, Take 1 tablet (500 mg total) by mouth 2 (two) times daily with a meal., Disp: 30 tablet, Rfl: 0   predniSONE (DELTASONE) 20 MG tablet, Take 2 tablets (40 mg total) by mouth daily., Disp: 8 tablet, Rfl: 0   Medications ordered in this encounter:  Meds ordered this encounter  Medications   albuterol (VENTOLIN HFA) 108 (90 Base) MCG/ACT inhaler    Sig: Inhale 2 puffs into the lungs every 6 (six) hours as needed for wheezing or shortness of breath.    Dispense:  8 g    Refill:  0    Supervising Provider:   Merrilee Jansky [[id number]]   budesonide-formoterol (SYMBICORT) 80-4.5 MCG/ACT inhaler    Sig: Inhale 2 puffs into the lungs 2 (two) times daily.    Dispense:  1 each    Refill:  0    Supervising Provider:   Merrilee Jansky [id number]     *If you need refills on other medications prior to your next appointment, please contact your pharmacy*  Follow-Up: Call back or seek an in-person evaluation if the symptoms worsen or if the condition fails to improve as anticipated.   Virtual Care [phone]  Other Instructions Needs to establish with PCP   If you have been instructed to have  an in-person evaluation today at a local Urgent Care facility, please use the link below. It will take you to a list of all of our available [facility], including address, phone number and hours of operation. Please do not delay care.  [facility]  If you or a family member do not have a primary care provider, use the link below to schedule a visit and establish care. When you choose a [facility] primary care physician or advanced practice provider, you gain a long-term partner in health. Find a Primary Care Provider  Learn more about [facility]'s in-office and virtual care  options: [facility] - Get Care Now

## 2023-08-27 NOTE — Progress Notes (Signed)
Virtual Visit Consent   Jacqueline Velasquez, you are scheduled for a virtual visit with a  provider today. Just as with appointments in the office, your consent must be obtained to participate. Your consent will be active for this visit and any virtual visit you may have with one of our providers in the next 365 days. If you have a MyChart account, a copy of this consent can be sent to you electronically.  As this is a virtual visit, video technology does not allow for your provider to perform a traditional examination. This may limit your provider's ability to fully assess your condition. If your provider identifies any concerns that need to be evaluated in person or the need to arrange testing (such as labs, EKG, etc.), we will make arrangements to do so. Although advances in technology are sophisticated, we cannot ensure that it will always work on either your end or our end. If the connection with a video visit is poor, the visit may have to be switched to a telephone visit. With either a video or telephone visit, we are not always able to ensure that we have a secure connection.  By engaging in this virtual visit, you consent to the provision of healthcare and authorize for your insurance to be billed (if applicable) for the services provided during this visit. Depending on your insurance coverage, you may receive a charge related to this service.  I need to obtain your verbal consent now. Are you willing to proceed with your visit today? Jacqueline Velasquez has provided verbal consent on 08/27/2023 for a virtual visit (video or telephone). Jacqueline Rigg, NP  Date: 08/27/2023 4:20 PM  Virtual Visit via Video Note   I, Jacqueline Velasquez, connected with  Jacqueline Velasquez  ([id number], May 26, 1996) on 08/27/23 at  4:00 PM EST by a video-enabled telemedicine application and verified that I am speaking with the correct person using two identifiers.  Location: Patient: Virtual Visit Location Patient:  Home Provider: Virtual Visit Location Provider: Home Office   I discussed the limitations of evaluation and management by telemedicine and the availability of in person appointments. The patient expressed understanding and agreed to proceed.    History of Present Illness: Jacqueline Velasquez is a 28 y.o. who identifies as a female who was assigned female at birth, and is being seen today for albuterol inhaler refill.  Jacqueline Velasquez is requesting refill of her saba inhaler. States she ran out of her inhaler yesterday. Her symptoms of asthma exacerbation include: wheezing, cough and shortness of breath.   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/27/2019   Severe recurrent major depression without psychotic features (HCC) 03/27/2019   Mood disorder (HCC) 01/24/2012   Borderline personality disorder (HCC) 01/24/2012    Allergies:  Allergies  Allergen Reactions   Clindamycin/Lincomycin    Medications:  Current Outpatient Medications:    albuterol (PROAIR HFA) 108 (90 Base) MCG/ACT inhaler, Inhale 1-2 puffs into the lungs every 6 (six) hours as needed for wheezing or shortness of breath., Disp: 8 g, Rfl: 0   albuterol (VENTOLIN HFA) 108 (90 Base) MCG/ACT inhaler, Inhale 2 puffs into the lungs every 6 (six) hours as needed for wheezing or shortness of breath., Disp: 8 g, Rfl: 0   amoxicillin-clavulanate (AUGMENTIN) 875-125 MG tablet, Take 1 tablet by mouth 2 (two) times daily., Disp: 14 tablet, Rfl: 0   benzonatate (TESSALON)  100 MG capsule, Take 1 capsule (100 mg total) by mouth every 8 (eight) hours., Disp: 15 capsule, Rfl: 0   cetirizine (ZYRTEC ALLERGY) 10 MG tablet, Take 1 tablet (10 mg total) by mouth daily., Disp: 30 tablet, Rfl: 2   chlorhexidine (PERIDEX) 0.12 % solution, Use as directed 15 mLs in the mouth or throat 2 (two) times daily., Disp: 120 mL, Rfl: 0   fluticasone furoate-vilanterol (BREO ELLIPTA)  100-25 MCG/ACT AEPB, Inhale 1 puff into the lungs daily., Disp: 1 each, Rfl: 11   ipratropium (ATROVENT) 0.03 % nasal spray, Place 2 sprays into both nostrils every 12 (twelve) hours., Disp: 30 mL, Rfl: 12   lidocaine (XYLOCAINE) 2 % solution, Use as directed 10 mLs in the mouth or throat every 3 (three) hours as needed for mouth pain., Disp: 100 mL, Rfl: 0   naproxen (NAPROSYN) 500 MG tablet, Take 1 tablet (500 mg total) by mouth 2 (two) times daily with a meal., Disp: 30 tablet, Rfl: 0   predniSONE (DELTASONE) 20 MG tablet, Take 2 tablets (40 mg total) by mouth daily., Disp: 8 tablet, Rfl: 0  Observations/Objective: Patient is well-developed, well-nourished in no acute distress.  Resting comfortably at home.  Head is normocephalic, atraumatic.  No labored breathing.  Speech is clear and coherent with logical content.  Patient is alert and oriented at baseline.    Assessment and Plan: 1. Exacerbation of asthma, unspecified asthma severity, unspecified whether persistent (Primary) - albuterol (VENTOLIN HFA) 108 (90 Base) MCG/ACT inhaler; Inhale 2 puffs into the lungs every 6 (six) hours as needed for wheezing or shortness of breath.  Dispense: 8 g; Refill: 0  Needs to establish with PCP  Follow Up Instructions: I discussed the assessment and treatment plan with the patient. The patient was provided an opportunity to ask questions and all were answered. The patient agreed with the plan and demonstrated an understanding of the instructions.  A copy of instructions were sent to the patient via MyChart unless otherwise noted below.    The patient was advised to call back or seek an in-person evaluation if the symptoms worsen or if the condition fails to improve as anticipated.    Jacqueline Rigg, NP

## 2023-08-29 ENCOUNTER — Telehealth: Payer: Medicaid Other

## 2023-08-29 ENCOUNTER — Telehealth: Payer: Medicaid Other | Admitting: Emergency Medicine

## 2023-08-29 ENCOUNTER — Other Ambulatory Visit: Payer: Self-pay | Admitting: Nurse Practitioner

## 2023-08-29 DIAGNOSIS — K047 Periapical abscess without sinus: Secondary | ICD-10-CM

## 2023-08-29 MED ORDER — CHLORHEXIDINE GLUCONATE 0.12 % MT SOLN: 15.0000 mL | Freq: Two times a day (BID) | OROMUCOSAL | 0 refills | Status: AC

## 2023-08-29 MED ORDER — AMOXICILLIN-POT CLAVULANATE 875-125 MG PO TABS: 1.0000 | ORAL_TABLET | Freq: Two times a day (BID) | ORAL | 0 refills | Status: AC

## 2023-08-29 NOTE — Progress Notes (Signed)
Virtual Visit Consent   Jacqueline Velasquez, you are scheduled for a virtual visit with a [facility] provider today. Just as with appointments in the office, your consent must be obtained to participate. Your consent will be active for this visit and any virtual visit you may have with one of our providers in the next 365 days. If you have a MyChart account, a copy of this consent can be sent to you electronically.  As this is a virtual visit, video technology does not allow for your provider to perform a traditional examination. This may limit your provider's ability to fully assess your condition. If your provider identifies any concerns that need to be evaluated in person or the need to arrange testing (such as labs, EKG, etc.), we will make arrangements to do so. Although advances in technology are sophisticated, we cannot ensure that it will always work on either your end or our end. If the connection with a video visit is poor, the visit may have to be switched to a telephone visit. With either a video or telephone visit, we are not always able to ensure that we have a secure connection.  By engaging in this virtual visit, you consent to the provision of healthcare and authorize for your insurance to be billed (if applicable) for the services provided during this visit. Depending on your insurance coverage, you may receive a charge related to this service.  I need to obtain your verbal consent now. Are you willing to proceed with your visit today? SHATAVIA SANTOR has provided verbal consent on 08/29/2023 for a virtual visit (video or telephone). [name], NP  Date: 08/29/2023 3:54 PM  Virtual Visit via Video Note   I, [name], connected with  BRIANNIE Velasquez  ([id number], 21-Sep-1995) on 08/29/23 at  3:45 PM EST by a video-enabled telemedicine application and verified that I am speaking with the correct person using two identifiers.  Location: Patient: Virtual Visit Location Patient:  Home Provider: Virtual Visit Location Provider: Home Office   I discussed the limitations of evaluation and management by telemedicine and the availability of in person appointments. The patient expressed understanding and agreed to proceed.    History of Present Illness: Jacqueline Velasquez is a 28 y.o. who identifies as a female who was assigned female at birth, and is being seen today for dental abscess x2 days. Has several broken teeth old, from decay in L upper jaw that neede to be addressed. Has not seen a dentist yet. Now has swelling around some of these old decayed tooth fragments and pain, is worried has an abscess.   HPI: HPI  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/27/2019   Severe recurrent major depression without psychotic features (HCC) 03/27/2019   Mood disorder (HCC) 01/24/2012   Borderline personality disorder (HCC) 01/24/2012    Allergies:  Allergies  Allergen Reactions   Clindamycin/Lincomycin    Medications:  Current Outpatient Medications:    albuterol (PROAIR HFA) 108 (90 Base) MCG/ACT inhaler, Inhale 1-2 puffs into the lungs every 6 (six) hours as needed for wheezing or shortness of breath., Disp: 8 g, Rfl: 0   albuterol (VENTOLIN HFA) 108 (90 Base) MCG/ACT inhaler, Inhale 2 puffs into the lungs every 6 (six) hours as needed for wheezing or shortness of breath., Disp: 8 g, Rfl: 0   amoxicillin-clavulanate (AUGMENTIN) 875-125 MG tablet, Take 1 tablet by  mouth 2 (two) times daily., Disp: 14 tablet, Rfl: 0   benzonatate (TESSALON) 100 MG capsule, Take 1 capsule (100 mg total) by mouth every 8 (eight) hours., Disp: 15 capsule, Rfl: 0   budesonide-formoterol (SYMBICORT) 80-4.5 MCG/ACT inhaler, Inhale 2 puffs into the lungs 2 (two) times daily., Disp: 1 each, Rfl: 0   cetirizine (ZYRTEC ALLERGY) 10 MG tablet, Take 1 tablet (10 mg total) by mouth daily., Disp: 30 tablet, Rfl: 2    chlorhexidine (PERIDEX) 0.12 % solution, Use as directed 15 mLs in the mouth or throat 2 (two) times daily., Disp: 120 mL, Rfl: 0   fluticasone furoate-vilanterol (BREO ELLIPTA) 100-25 MCG/ACT AEPB, Inhale 1 puff into the lungs daily., Disp: 1 each, Rfl: 11   ipratropium (ATROVENT) 0.03 % nasal spray, Place 2 sprays into both nostrils every 12 (twelve) hours., Disp: 30 mL, Rfl: 12   lidocaine (XYLOCAINE) 2 % solution, Use as directed 10 mLs in the mouth or throat every 3 (three) hours as needed for mouth pain., Disp: 100 mL, Rfl: 0   naproxen (NAPROSYN) 500 MG tablet, Take 1 tablet (500 mg total) by mouth 2 (two) times daily with a meal., Disp: 30 tablet, Rfl: 0   predniSONE (DELTASONE) 20 MG tablet, Take 2 tablets (40 mg total) by mouth daily., Disp: 8 tablet, Rfl: 0  Observations/Objective: Patient is well-developed, well-nourished in no acute distress.  Resting comfortably  at home.  Head is normocephalic, atraumatic.  No labored breathing.  Speech is clear and coherent with logical content.  Patient is alert and oriented at baseline.    Assessment and Plan: 1. Dental infection - amoxicillin-clavulanate (AUGMENTIN) 875-125 MG tablet; Take 1 tablet by mouth 2 (two) times daily.  Dispense: 14 tablet; Refill: 0  Also rx chlorhexidene mouth wash. Pt to contact medicaid for assistance finding a dentist  Follow Up Instructions: I discussed the assessment and treatment plan with the patient. The patient was provided an opportunity to ask questions and all were answered. The patient agreed with the plan and demonstrated an understanding of the instructions.  A copy of instructions were sent to the patient via MyChart unless otherwise noted below.   The patient was advised to call back or seek an in-person evaluation if the symptoms worsen or if the condition fails to improve as anticipated.    [name], NP

## 2023-08-29 NOTE — Patient Instructions (Signed)
Jacqueline Velasquez, thank you for joining [name], NP for today's virtual visit.  While this provider is not your primary care provider (PCP), if your PCP is located in our provider database this encounter information will be shared with them immediately following your visit.   A [facility] MyChart account gives you access to today's visit and all your visits, tests, and labs performed at [facility] " click here if you don't have a [facility] MyChart account or go to [email or web address]  Consent: (Patient) Jacqueline Velasquez provided verbal consent for this virtual visit at the beginning of the encounter.  Current Medications:  Current Outpatient Medications:    albuterol (PROAIR HFA) 108 (90 Base) MCG/ACT inhaler, Inhale 1-2 puffs into the lungs every 6 (six) hours as needed for wheezing or shortness of breath., Disp: 8 g, Rfl: 0   albuterol (VENTOLIN HFA) 108 (90 Base) MCG/ACT inhaler, Inhale 2 puffs into the lungs every 6 (six) hours as needed for wheezing or shortness of breath., Disp: 8 g, Rfl: 0   amoxicillin-clavulanate (AUGMENTIN) 875-125 MG tablet, Take 1 tablet by mouth 2 (two) times daily., Disp: 14 tablet, Rfl: 0   benzonatate (TESSALON) 100 MG capsule, Take 1 capsule (100 mg total) by mouth every 8 (eight) hours., Disp: 15 capsule, Rfl: 0   budesonide-formoterol (SYMBICORT) 80-4.5 MCG/ACT inhaler, Inhale 2 puffs into the lungs 2 (two) times daily., Disp: 1 each, Rfl: 0   cetirizine (ZYRTEC ALLERGY) 10 MG tablet, Take 1 tablet (10 mg total) by mouth daily., Disp: 30 tablet, Rfl: 2   chlorhexidine (PERIDEX) 0.12 % solution, Use as directed 15 mLs in the mouth or throat 2 (two) times daily., Disp: 120 mL, Rfl: 0   fluticasone furoate-vilanterol (BREO ELLIPTA) 100-25 MCG/ACT AEPB, Inhale 1 puff into the lungs daily., Disp: 1 each, Rfl: 11   ipratropium (ATROVENT) 0.03 % nasal spray, Place 2 sprays into both nostrils every 12 (twelve) hours., Disp: 30 mL, Rfl: 12    lidocaine (XYLOCAINE) 2 % solution, Use as directed 10 mLs in the mouth or throat every 3 (three) hours as needed for mouth pain., Disp: 100 mL, Rfl: 0   naproxen (NAPROSYN) 500 MG tablet, Take 1 tablet (500 mg total) by mouth 2 (two) times daily with a meal., Disp: 30 tablet, Rfl: 0   predniSONE (DELTASONE) 20 MG tablet, Take 2 tablets (40 mg total) by mouth daily., Disp: 8 tablet, Rfl: 0   Medications ordered in this encounter:  Meds ordered this encounter  Medications   chlorhexidine (PERIDEX) 0.12 % solution    Sig: Use as directed 15 mLs in the mouth or throat 2 (two) times daily.    Dispense:  120 mL    Refill:  0   amoxicillin-clavulanate (AUGMENTIN) 875-125 MG tablet    Sig: Take 1 tablet by mouth 2 (two) times daily.    Dispense:  14 tablet    Refill:  0     *If you need refills on other medications prior to your next appointment, please contact your pharmacy*  Follow-Up: Call back or seek an in-person evaluation if the symptoms worsen or if the condition fails to improve as anticipated.  [facility] [phone]  Other Instructions Please contact Medicaid for help finding a dentist - this will keep happening, or worse, until you get help with your teeth   If you have been instructed to have an in-person evaluation today at a local Urgent Care facility, please use the  link below. It will take you to a list of all of our available [facility], including address, phone number and hours of operation. Please do not delay care.  [facility]  If you or a family member do not have a primary care provider, use the link below to schedule a visit and establish care. When you choose a [facility] primary care physician or advanced practice provider, you gain a long-term partner in health. Find a Primary Care Provider  Learn more about [facility] in-office and virtual care options: [facility] - Get Care Now

## 2024-01-06 ENCOUNTER — Encounter
